# Patient Record
Sex: Female | Born: 1970 | Race: White | Hispanic: No | Marital: Married | Smoking: Never smoker
Health system: Southern US, Community
[De-identification: ages and names within clinical notes are randomized; demographics above are authoritative.]

## PROBLEM LIST (undated history)

## (undated) DIAGNOSIS — F418 Other specified anxiety disorders: Secondary | ICD-10-CM

## (undated) DIAGNOSIS — E785 Hyperlipidemia, unspecified: Secondary | ICD-10-CM

## (undated) DIAGNOSIS — I10 Essential (primary) hypertension: Secondary | ICD-10-CM

## (undated) DIAGNOSIS — D649 Anemia, unspecified: Secondary | ICD-10-CM

## (undated) DIAGNOSIS — E119 Type 2 diabetes mellitus without complications: Secondary | ICD-10-CM

## (undated) HISTORY — DX: Essential (primary) hypertension: I10

## (undated) HISTORY — DX: Type 2 diabetes mellitus without complications: E11.9

## (undated) HISTORY — DX: Hyperlipidemia, unspecified: E78.5

## (undated) HISTORY — DX: Other specified anxiety disorders: F41.8

---

## 1998-10-08 ENCOUNTER — Other Ambulatory Visit: Admission: RE | Admit: 1998-10-08 | Discharge: 1998-10-08 | Payer: Self-pay | Admitting: Obstetrics and Gynecology

## 1999-12-08 ENCOUNTER — Other Ambulatory Visit: Admission: RE | Admit: 1999-12-08 | Discharge: 1999-12-08 | Payer: Self-pay | Admitting: Obstetrics and Gynecology

## 2003-08-06 ENCOUNTER — Other Ambulatory Visit: Admission: RE | Admit: 2003-08-06 | Discharge: 2003-08-06 | Payer: Self-pay | Admitting: Obstetrics and Gynecology

## 2004-11-25 ENCOUNTER — Other Ambulatory Visit: Payer: Self-pay

## 2004-11-25 ENCOUNTER — Emergency Department: Payer: Self-pay | Admitting: Emergency Medicine

## 2013-02-18 ENCOUNTER — Ambulatory Visit: Payer: Self-pay | Admitting: Obstetrics and Gynecology

## 2013-02-19 ENCOUNTER — Ambulatory Visit: Payer: Self-pay | Admitting: Obstetrics and Gynecology

## 2013-04-18 ENCOUNTER — Ambulatory Visit: Payer: Self-pay

## 2014-02-04 LAB — HM DIABETES EYE EXAM

## 2015-02-04 ENCOUNTER — Other Ambulatory Visit: Payer: Self-pay | Admitting: Unknown Physician Specialty

## 2015-03-19 ENCOUNTER — Ambulatory Visit: Payer: Self-pay | Admitting: Unknown Physician Specialty

## 2015-04-30 ENCOUNTER — Other Ambulatory Visit: Payer: Self-pay | Admitting: Unknown Physician Specialty

## 2015-05-31 ENCOUNTER — Other Ambulatory Visit: Payer: Self-pay | Admitting: Unknown Physician Specialty

## 2015-09-11 ENCOUNTER — Other Ambulatory Visit: Payer: Self-pay | Admitting: Unknown Physician Specialty

## 2015-10-17 ENCOUNTER — Other Ambulatory Visit: Payer: Self-pay | Admitting: Unknown Physician Specialty

## 2015-10-20 ENCOUNTER — Other Ambulatory Visit: Payer: Self-pay | Admitting: Unknown Physician Specialty

## 2015-10-22 DIAGNOSIS — I10 Essential (primary) hypertension: Secondary | ICD-10-CM | POA: Insufficient documentation

## 2015-10-22 DIAGNOSIS — F418 Other specified anxiety disorders: Secondary | ICD-10-CM | POA: Insufficient documentation

## 2015-10-22 DIAGNOSIS — E1165 Type 2 diabetes mellitus with hyperglycemia: Secondary | ICD-10-CM | POA: Insufficient documentation

## 2015-10-22 DIAGNOSIS — E785 Hyperlipidemia, unspecified: Secondary | ICD-10-CM | POA: Insufficient documentation

## 2015-10-26 ENCOUNTER — Encounter: Payer: Self-pay | Admitting: Unknown Physician Specialty

## 2015-10-26 ENCOUNTER — Ambulatory Visit (INDEPENDENT_AMBULATORY_CARE_PROVIDER_SITE_OTHER): Payer: BC Managed Care – PPO | Admitting: Unknown Physician Specialty

## 2015-10-26 VITALS — BP 154/90 | HR 114 | Temp 98.4°F | Ht 64.2 in | Wt 164.2 lb

## 2015-10-26 DIAGNOSIS — I10 Essential (primary) hypertension: Secondary | ICD-10-CM

## 2015-10-26 DIAGNOSIS — E119 Type 2 diabetes mellitus without complications: Secondary | ICD-10-CM | POA: Diagnosis not present

## 2015-10-26 DIAGNOSIS — E1165 Type 2 diabetes mellitus with hyperglycemia: Secondary | ICD-10-CM

## 2015-10-26 DIAGNOSIS — E785 Hyperlipidemia, unspecified: Secondary | ICD-10-CM | POA: Diagnosis not present

## 2015-10-26 LAB — BAYER DCA HB A1C WAIVED: HB A1C (BAYER DCA - WAIVED): 13.2 % — ABNORMAL HIGH (ref <7.0)

## 2015-10-26 LAB — MICROALBUMIN, URINE WAIVED
Creatinine, Urine Waived: 50 mg/dL (ref 10–300)
Microalb, Ur Waived: 10 mg/L (ref 0–19)

## 2015-10-26 LAB — LIPID PANEL PICCOLO, WAIVED
Chol/HDL Ratio Piccolo,Waive: 3.9 mg/dL
Cholesterol Piccolo, Waived: 151 mg/dL (ref <200)
HDL Chol Piccolo, Waived: 38 mg/dL — ABNORMAL LOW (ref >59)
LDL Chol Calc Piccolo Waived: 86 mg/dL (ref <100)
Triglycerides Piccolo,Waived: 133 mg/dL (ref <150)
VLDL Chol Calc Piccolo,Waive: 27 mg/dL (ref <30)

## 2015-10-26 MED ORDER — EMPAGLIFLOZIN 25 MG PO TABS: 25.0000 mg | ORAL_TABLET | Freq: Every day | ORAL | Status: DC

## 2015-10-26 MED ORDER — AMLODIPINE BESYLATE 5 MG PO TABS: 5.0000 mg | ORAL_TABLET | Freq: Every day | ORAL | Status: DC

## 2015-10-26 MED ORDER — ATORVASTATIN CALCIUM 10 MG PO TABS: 10.0000 mg | ORAL_TABLET | Freq: Every day | ORAL | Status: DC

## 2015-10-26 MED ORDER — HYDROCHLOROTHIAZIDE 25 MG PO TABS: 25.0000 mg | ORAL_TABLET | Freq: Every day | ORAL | Status: DC

## 2015-10-26 MED ORDER — VALSARTAN 160 MG PO TABS: 160.0000 mg | ORAL_TABLET | Freq: Every day | ORAL | Status: DC

## 2015-10-26 MED ORDER — METFORMIN HCL ER 500 MG PO TB24
1000.0000 mg | ORAL_TABLET | Freq: Two times a day (BID) | ORAL | Status: DC
Start: 2015-10-26 — End: 2016-07-06

## 2015-10-26 NOTE — Progress Notes (Signed)
BP 154/90 mmHg  Pulse 114  Temp(Src) 98.4 F (36.9 C)  Ht 5' 4.2" (1.631 m)  Wt 164 lb 3.2 oz (74.481 kg)  BMI 28.00 kg/m2  SpO2 100%  LMP 08/30/2015 (Approximate)   Subjective:    Patient ID: Ellen May, female    DOB: 1971/02/27, 45 y.o.   MRN: [id number]  HPI: Ellen May is a 45 y.o. female  Chief Complaint  Patient presents with  . Diabetes  . Hyperlipidemia  . Hypertension   This pt is lost to f/u.    Diabetes:  Her insurance is no longer covering Invokanna.  She said she really liked it as it helped her lose weight Using medications without difficulties No hypoglycemic episodes No hyperglycemic episodes Feet problems  Blood Sugars averaging 130-140 while on Invokanna eye exam within last year  Hypertension:  Using medications without difficulty Average home BPs120'2/80's  Using medication without problems or lightheadedness No chest pain with exertion or shortness of breath No Edema  Elevated Cholesterol: Using medications without problems No Muscle aches Diet compliance: good Exercise:Regular and daily ----- Relevant past medical, surgical, family and social history reviewed and updated as indicated. Interim medical history since our last visit reviewed. Allergies and medications reviewed and updated.  Review of Systems  Per HPI unless specifically indicated above     Objective:    BP 154/90 mmHg  Pulse 114  Temp(Src) 98.4 F (36.9 C)  Ht 5' 4.2" (1.631 m)  Wt 164 lb 3.2 oz (74.481 kg)  BMI 28.00 kg/m2  SpO2 100%  LMP 08/30/2015 (Approximate)  Wt Readings from Last 3 Encounters:  10/26/15 164 lb 3.2 oz (74.481 kg)  12/18/14 162 lb (73.483 kg)    Physical Exam  Constitutional: She is oriented to person, place, and time. She appears well-developed and well-nourished. No distress.  HENT:  Head: Normocephalic and atraumatic.  Eyes: Conjunctivae and lids are normal. Right eye exhibits no discharge. Left eye exhibits no  discharge. No scleral icterus.  Neck: Normal range of motion. Neck supple. No JVD present. Carotid bruit is not present.  Cardiovascular: Normal rate, regular rhythm and normal heart sounds.   Pulmonary/Chest: Effort normal and breath sounds normal.  Abdominal: Normal appearance. There is no splenomegaly or hepatomegaly.  Musculoskeletal: Normal range of motion.  Neurological: She is alert and oriented to person, place, and time.  Skin: Skin is warm, dry and intact. No rash noted. No pallor.  Psychiatric: She has a normal mood and affect. Her behavior is normal. Judgment and thought content normal.    Results for orders placed or performed in visit on 10/22/15  HM DIABETES EYE EXAM  Result Value Ref Range   HM Diabetic Eye Exam Retinopathy (A) No Retinopathy      Assessment & Plan:   Problem List Items Addressed This Visit      Unprioritized   Hyperlipidemia    LDL is 88.  Continue present medication      Relevant Medications   amLODipine (NORVASC) 5 MG tablet   atorvastatin (LIPITOR) 10 MG tablet   hydrochlorothiazide (HYDRODIURIL) 25 MG tablet   valsartan (DIOVAN) 160 MG tablet   Hypertension    White coat hypertension      Relevant Medications   amLODipine (NORVASC) 5 MG tablet   atorvastatin (LIPITOR) 10 MG tablet   hydrochlorothiazide (HYDRODIURIL) 25 MG tablet   valsartan (DIOVAN) 160 MG tablet   Poorly controlled type 2 diabetes mellitus (HCC) - Primary    Hgb  A1C 13.2.  Pt stopped Invokana due to cost.  Unwilling to start insulin.  Will start Jardiance 25 mg daily which should be covered      Relevant Medications   empagliflozin (JARDIANCE) 25 MG TABS tablet   atorvastatin (LIPITOR) 10 MG tablet   metFORMIN (GLUCOPHAGE-XR) 500 MG 24 hr tablet   valsartan (DIOVAN) 160 MG tablet       Follow up plan: Return in about 3 months (around 01/25/2016).  Discussed with pt lost to f/u.  She says she will do better coming in on a regular basis

## 2015-10-26 NOTE — Assessment & Plan Note (Signed)
White coat hypertension

## 2015-10-26 NOTE — Assessment & Plan Note (Addendum)
Hgb A1C 13.2.  Pt stopped Invokana due to cost.  Unwilling to start insulin.  Will start Jardiance 25 mg daily which should be covered

## 2015-10-26 NOTE — Assessment & Plan Note (Signed)
LDL is 88.  Continue present medication

## 2015-10-27 ENCOUNTER — Encounter: Payer: Self-pay | Admitting: Unknown Physician Specialty

## 2015-10-27 LAB — CBC WITH DIFFERENTIAL/PLATELET
Basophils Absolute: 0 10*3/uL (ref 0.0–0.2)
Basos: 0 %
EOS (ABSOLUTE): 0.1 10*3/uL (ref 0.0–0.4)
Eos: 1 %
Hematocrit: 37.5 % (ref 34.0–46.6)
Hemoglobin: 11.7 g/dL (ref 11.1–15.9)
Immature Grans (Abs): 0 10*3/uL (ref 0.0–0.1)
Immature Granulocytes: 0 %
Lymphocytes Absolute: 2.5 10*3/uL (ref 0.7–3.1)
Lymphs: 34 %
MCH: 22.4 pg — ABNORMAL LOW (ref 26.6–33.0)
MCHC: 31.2 g/dL — ABNORMAL LOW (ref 31.5–35.7)
MCV: 72 fL — ABNORMAL LOW (ref 79–97)
Monocytes Absolute: 0.4 10*3/uL (ref 0.1–0.9)
Monocytes: 6 %
Neutrophils Absolute: 4.3 10*3/uL (ref 1.4–7.0)
Neutrophils: 59 %
Platelets: 314 10*3/uL (ref 150–379)
RBC: 5.23 x10E6/uL (ref 3.77–5.28)
RDW: 15.7 % — ABNORMAL HIGH (ref 12.3–15.4)
WBC: 7.3 10*3/uL (ref 3.4–10.8)

## 2015-10-27 LAB — COMPREHENSIVE METABOLIC PANEL
ALT: 63 IU/L — ABNORMAL HIGH (ref 0–32)
AST: 44 IU/L — ABNORMAL HIGH (ref 0–40)
Albumin/Globulin Ratio: 1.6 (ref 1.2–2.2)
Albumin: 4 g/dL (ref 3.5–5.5)
Alkaline Phosphatase: 84 IU/L (ref 39–117)
BUN/Creatinine Ratio: 13 (ref 9–23)
BUN: 6 mg/dL (ref 6–24)
Bilirubin Total: 0.4 mg/dL (ref 0.0–1.2)
CO2: 21 mmol/L (ref 18–29)
Calcium: 9.1 mg/dL (ref 8.7–10.2)
Chloride: 97 mmol/L (ref 96–106)
Creatinine, Ser: 0.45 mg/dL — ABNORMAL LOW (ref 0.57–1.00)
GFR calc Af Amer: 141 mL/min/{1.73_m2} (ref >59)
GFR calc non Af Amer: 122 mL/min/{1.73_m2} (ref >59)
Globulin, Total: 2.5 g/dL (ref 1.5–4.5)
Glucose: 298 mg/dL — ABNORMAL HIGH (ref 65–99)
Potassium: 4.2 mmol/L (ref 3.5–5.2)
Sodium: 135 mmol/L (ref 134–144)
Total Protein: 6.5 g/dL (ref 6.0–8.5)

## 2015-10-27 LAB — URIC ACID: Uric Acid: 2.4 mg/dL — ABNORMAL LOW (ref 2.5–7.1)

## 2015-10-27 NOTE — Progress Notes (Signed)
Quick Note:  Letter sent notifying about labs. Discussed hyperglycemia and effect on liver ______

## 2016-01-31 ENCOUNTER — Ambulatory Visit: Payer: BC Managed Care – PPO | Admitting: Unknown Physician Specialty

## 2016-02-12 ENCOUNTER — Other Ambulatory Visit: Payer: Self-pay | Admitting: Unknown Physician Specialty

## 2016-02-14 NOTE — Telephone Encounter (Signed)
Your patient.  Thanks 

## 2016-05-14 ENCOUNTER — Other Ambulatory Visit: Payer: Self-pay | Admitting: Unknown Physician Specialty

## 2016-06-12 ENCOUNTER — Other Ambulatory Visit: Payer: Self-pay | Admitting: Unknown Physician Specialty

## 2016-07-06 ENCOUNTER — Telehealth: Payer: Self-pay | Admitting: Unknown Physician Specialty

## 2016-07-06 MED ORDER — AMLODIPINE BESYLATE 5 MG PO TABS: 5.0000 mg | ORAL_TABLET | Freq: Every day | ORAL | 2 refills | Status: DC

## 2016-07-06 MED ORDER — METFORMIN HCL ER 500 MG PO TB24: 1000.0000 mg | ORAL_TABLET | Freq: Two times a day (BID) | ORAL | 3 refills | Status: DC

## 2016-07-06 MED ORDER — EMPAGLIFLOZIN 25 MG PO TABS: 25.0000 mg | ORAL_TABLET | Freq: Every day | ORAL | 3 refills | Status: DC

## 2016-07-06 MED ORDER — ATORVASTATIN CALCIUM 10 MG PO TABS: 10.0000 mg | ORAL_TABLET | Freq: Every day | ORAL | 3 refills | Status: DC

## 2016-07-06 MED ORDER — HYDROCHLOROTHIAZIDE 25 MG PO TABS: 25.0000 mg | ORAL_TABLET | Freq: Every day | ORAL | 3 refills | Status: DC

## 2016-07-06 MED ORDER — VALSARTAN 160 MG PO TABS: 160.0000 mg | ORAL_TABLET | Freq: Every day | ORAL | 3 refills | Status: DC

## 2016-07-06 NOTE — Telephone Encounter (Signed)
Routing to provider. Patient has an appointment 08/01/15.

## 2016-07-06 NOTE — Telephone Encounter (Signed)
Pt called for a refill on all of her medications. [facility]. Appt scheduled 07/28/16.

## 2016-07-28 ENCOUNTER — Ambulatory Visit: Payer: BC Managed Care – PPO | Admitting: Unknown Physician Specialty

## 2016-08-04 ENCOUNTER — Ambulatory Visit: Payer: BC Managed Care – PPO | Admitting: Unknown Physician Specialty

## 2016-08-14 ENCOUNTER — Encounter: Payer: Self-pay | Admitting: Unknown Physician Specialty

## 2016-08-14 ENCOUNTER — Ambulatory Visit (INDEPENDENT_AMBULATORY_CARE_PROVIDER_SITE_OTHER): Payer: BC Managed Care – PPO | Admitting: Unknown Physician Specialty

## 2016-08-14 VITALS — BP 174/102 | HR 108 | Temp 98.2°F | Ht 66.0 in | Wt 158.0 lb

## 2016-08-14 DIAGNOSIS — E1165 Type 2 diabetes mellitus with hyperglycemia: Secondary | ICD-10-CM | POA: Diagnosis not present

## 2016-08-14 DIAGNOSIS — R059 Cough, unspecified: Secondary | ICD-10-CM

## 2016-08-14 DIAGNOSIS — R509 Fever, unspecified: Secondary | ICD-10-CM | POA: Diagnosis not present

## 2016-08-14 DIAGNOSIS — I10 Essential (primary) hypertension: Secondary | ICD-10-CM | POA: Diagnosis not present

## 2016-08-14 DIAGNOSIS — E7800 Pure hypercholesterolemia, unspecified: Secondary | ICD-10-CM | POA: Diagnosis not present

## 2016-08-14 DIAGNOSIS — G2581 Restless legs syndrome: Secondary | ICD-10-CM | POA: Insufficient documentation

## 2016-08-14 DIAGNOSIS — R05 Cough: Secondary | ICD-10-CM | POA: Diagnosis not present

## 2016-08-14 LAB — BAYER DCA HB A1C WAIVED: HB A1C (BAYER DCA - WAIVED): 12.2 % — ABNORMAL HIGH (ref <7.0)

## 2016-08-14 LAB — MICROALBUMIN, URINE WAIVED
Creatinine, Urine Waived: 200 mg/dL (ref 10–300)
Microalb, Ur Waived: 150 mg/L — ABNORMAL HIGH (ref 0–19)

## 2016-08-14 LAB — VERITOR FLU A/B WAIVED
Influenza A: NEGATIVE
Influenza B: NEGATIVE

## 2016-08-14 MED ORDER — AZITHROMYCIN 250 MG PO TABS: ORAL_TABLET | ORAL | 0 refills | Status: DC

## 2016-08-14 MED ORDER — HYDROCOD POLST-CPM POLST ER 10-8 MG/5ML PO SUER: 5.0000 mL | Freq: Two times a day (BID) | ORAL | 0 refills | Status: DC | PRN

## 2016-08-14 NOTE — Assessment & Plan Note (Addendum)
High today.  Pt admits not taking medicine for several days.  Patient advised to restart medications and check blood pressure measurements at home regularly. She will return to clinic in one month with measurements for review.

## 2016-08-14 NOTE — Assessment & Plan Note (Signed)
Checking ferratin levels today.

## 2016-08-14 NOTE — Progress Notes (Signed)
BP (!) 174/102 (BP Location: Left Arm, Patient Position: Sitting, Cuff Size: Normal)   Pulse (!) 108   Temp 98.2 F (36.8 C)   Ht 5\' 6"  (1.676 m) Comment: pt had shoes on  Wt 158 lb (71.7 kg) Comment: pt had shoes on  LMP 08/11/2016 (Exact Date)   SpO2 99%   BMI 25.50 kg/m    Subjective:    Patient ID: Ellen May, female    DOB: Aug 24, 1970, 46 y.o.   MRN: [id number]  HPI: Ellen May is a 46 y.o. female    Chief Complaint  Patient presents with  . Diabetes    pt states last eye exam in chart is correct  . Hyperlipidemia  . Hypertension  . other    pt states she had not taken any medications since Thursday  . URI    pt states she has had a fever, congestion, cough, some body aches, and chills. States this started Friday  . fidgeting    pt states she has been fidgeting a lot for the past few months     URI Patient notes that she had a fever of 101 degrees without any symptoms about a week and a half ago, which resolved on its own within a couple of days. She then started feeling fatigued with a cough, congestion, ear pain, lack of appetite, chills and a fever of 102.5 about 3 days ago. She has been using flonase and taking ibuprofen and coricidin, which she reports have helped with her fever, but not the rest of her symptoms. States that her symptoms have gotten neither better nor worse over the past 3 days.  Diabetes Not taking medications for the last few days due to lack of appetite and not feeling well, but reports that she normally takes them as prescribed. She does not check her blood glucose regularly at home, however she states that when she does they normally run high. Denies vision changes or any numbness/tingling in her hands or feet.  Hypertension Not taking medications for the last few days due to lack of appetite and not feeling well, but reports that she normally takes them as prescribed. Her blood pressure today is 174/102 mmHg, which she states is  better outside of the office, however she does not check her blood pressure regularly at home.   Hyperlipidemia Not taking medications for the last few days due to lack of appetite and not feeling well, but reports that she normally takes them as prescribed. She does not exercise regularly and states that her diet is "normal."  Sleeplessness/Restless Reports she is having difficulty sleeping the last couple of months due to "fidgeting." Notes that it feels like her "legs are anxious." She has taken ibuprofen pm with some relief. Sister has a history of restless leg syndrome and wonders if she had the same thing.  Pt reports heavy menses.     Relevant past medical, surgical, family and social history reviewed and updated as indicated. Interim medical history since our last visit reviewed. Allergies and medications reviewed and updated.  Review of Systems  Constitutional: Positive for appetite change, chills, diaphoresis, fatigue and fever.  HENT: Positive for congestion, postnasal drip, rhinorrhea, sinus pressure, sneezing and tinnitus. Negative for ear discharge, ear pain, facial swelling, hearing loss, sinus pain, sore throat and trouble swallowing.   Eyes: Negative for pain, discharge, itching and visual disturbance.  Respiratory: Positive for cough. Negative for chest tightness, shortness of breath and wheezing.   Cardiovascular:  Negative for chest pain, palpitations and leg swelling.  Gastrointestinal: Negative for abdominal pain, constipation, diarrhea, nausea and vomiting.  Endocrine: Negative.   Genitourinary: Negative for dysuria and frequency.  Musculoskeletal: Negative.  Negative for arthralgias.  Neurological: Negative for dizziness, light-headedness, numbness and headaches.  Psychiatric/Behavioral: Negative.     Per HPI unless specifically indicated above     Objective:    BP (!) 174/102 (BP Location: Left Arm, Patient Position: Sitting, Cuff Size: Normal)   Pulse (!) 108    Temp 98.2 F (36.8 C)   Ht 5\' 6"  (1.676 m) Comment: pt had shoes on  Wt 158 lb (71.7 kg) Comment: pt had shoes on  LMP 08/11/2016 (Exact Date)   SpO2 99%   BMI 25.50 kg/m   Wt Readings from Last 3 Encounters:  08/14/16 158 lb (71.7 kg)  10/26/15 164 lb 3.2 oz (74.5 kg)  12/18/14 162 lb (73.5 kg)    Physical Exam  Constitutional: She is oriented to person, place, and time. She appears well-developed and well-nourished.  HENT:  Head: Normocephalic and atraumatic.  Right Ear: External ear and ear canal normal.  Left Ear: External ear and ear canal normal.  Nose: Nose normal.  Mouth/Throat: Uvula is midline, oropharynx is clear and moist and mucous membranes are normal.  Eyes: Conjunctivae are normal. Right eye exhibits no discharge. Left eye exhibits no discharge. No scleral icterus.  Neck: Neck supple.    Slight tenderness to palpation over right submandibular lymph node.  Cardiovascular: Normal heart sounds.  Tachycardia present.   Pulmonary/Chest: Effort normal and breath sounds normal. No respiratory distress. She has no wheezes.  Abdominal: Soft. She exhibits no mass. There is no tenderness. There is no rebound and no guarding.  Neurological: She is alert and oriented to person, place, and time.  Skin: She is diaphoretic.  Psychiatric: She has a normal mood and affect. Her behavior is normal. Judgment and thought content normal.    Results for orders placed or performed in visit on 08/14/16  Veritor Flu A/B Waived  Result Value Ref Range   Influenza A Negative Negative   Influenza B Negative Negative      Assessment & Plan:   Problem List Items Addressed This Visit      Unprioritized   Hyperlipidemia    Lipid panel drawn today.      Relevant Orders   Lipid Panel w/o Chol/HDL Ratio   Hypertension    High today.  Pt admits not taking medicine for several days.  Patient advised to restart medications and check blood pressure measurements at home regularly. She  will return to clinic in one month with measurements for review.      Poorly controlled type 2 diabetes mellitus ([place])    Pt admits to non-compliance.  Refuses injectables.  Referred to endocrinology as she is not motivated at our office. Patient also encouraged to see ophthalmologist for eye exam.      Relevant Orders   Bayer DCA Hb A1c Waived   Comprehensive metabolic panel   Microalbumin, Urine Waived   Uric acid   Ambulatory referral to Endocrinology    Other Visit Diagnoses    Fever, unspecified fever cause    -  Primary   Relevant Orders   Veritor Flu A/B Waived (Completed)   CBC with Differential/Platelet   Cough       Flu negative.  Z pack as high risk patient.  Tussionex for symptoms.     Relevant Medications  chlorpheniramine-HYDROcodone (TUSSIONEX PENNKINETIC ER) 10-8 MG/5ML SUER   Other Relevant Orders   Veritor Flu A/B Waived (Completed)   CBC with Differential/Platelet   RLS (restless legs syndrome)       Checking ferratin level today.   Relevant Orders   Ferritin       Follow up plan: Return in about 4 weeks (around 09/11/2016).

## 2016-08-14 NOTE — Assessment & Plan Note (Signed)
Lipid panel drawn today 

## 2016-08-14 NOTE — Assessment & Plan Note (Addendum)
Pt admits to non-compliance.  Refuses injectables.  Referred to endocrinology as she is not motivated at our office. Patient also encouraged to see ophthalmologist for eye exam.

## 2016-08-15 LAB — URIC ACID: Uric Acid: 2.4 mg/dL — ABNORMAL LOW (ref 2.5–7.1)

## 2016-08-15 LAB — CBC WITH DIFFERENTIAL/PLATELET
Basophils Absolute: 0 10*3/uL (ref 0.0–0.2)
Basos: 1 %
EOS (ABSOLUTE): 0 10*3/uL (ref 0.0–0.4)
Eos: 1 %
Hematocrit: 39.3 % (ref 34.0–46.6)
Hemoglobin: 11.7 g/dL (ref 11.1–15.9)
Immature Grans (Abs): 0 10*3/uL (ref 0.0–0.1)
Immature Granulocytes: 1 %
Lymphocytes Absolute: 1.4 10*3/uL (ref 0.7–3.1)
Lymphs: 38 %
MCH: 20.6 pg — ABNORMAL LOW (ref 26.6–33.0)
MCHC: 29.8 g/dL — ABNORMAL LOW (ref 31.5–35.7)
MCV: 69 fL — ABNORMAL LOW (ref 79–97)
Monocytes Absolute: 0.4 10*3/uL (ref 0.1–0.9)
Monocytes: 12 %
Neutrophils Absolute: 1.7 10*3/uL (ref 1.4–7.0)
Neutrophils: 47 %
Platelets: 283 10*3/uL (ref 150–379)
RBC: 5.67 x10E6/uL — ABNORMAL HIGH (ref 3.77–5.28)
RDW: 16.1 % — ABNORMAL HIGH (ref 12.3–15.4)
WBC: 3.6 10*3/uL (ref 3.4–10.8)

## 2016-08-15 LAB — LIPID PANEL W/O CHOL/HDL RATIO
Cholesterol, Total: 182 mg/dL (ref 100–199)
HDL: 28 mg/dL — ABNORMAL LOW (ref >39)
LDL Calculated: 111 mg/dL — ABNORMAL HIGH (ref 0–99)
Triglycerides: 213 mg/dL — ABNORMAL HIGH (ref 0–149)
VLDL Cholesterol Cal: 43 mg/dL — ABNORMAL HIGH (ref 5–40)

## 2016-08-15 LAB — COMPREHENSIVE METABOLIC PANEL
ALT: 136 IU/L — ABNORMAL HIGH (ref 0–32)
AST: 54 IU/L — ABNORMAL HIGH (ref 0–40)
Albumin/Globulin Ratio: 1.3 (ref 1.2–2.2)
Albumin: 3.6 g/dL (ref 3.5–5.5)
Alkaline Phosphatase: 78 IU/L (ref 39–117)
BUN/Creatinine Ratio: 16 (ref 9–23)
BUN: 7 mg/dL (ref 6–24)
Bilirubin Total: 0.2 mg/dL (ref 0.0–1.2)
CO2: 20 mmol/L (ref 18–29)
Calcium: 8.9 mg/dL (ref 8.7–10.2)
Chloride: 99 mmol/L (ref 96–106)
Creatinine, Ser: 0.44 mg/dL — ABNORMAL LOW (ref 0.57–1.00)
GFR calc Af Amer: 141 mL/min/{1.73_m2} (ref >59)
GFR calc non Af Amer: 122 mL/min/{1.73_m2} (ref >59)
Globulin, Total: 2.8 g/dL (ref 1.5–4.5)
Glucose: 287 mg/dL — ABNORMAL HIGH (ref 65–99)
Potassium: 4.4 mmol/L (ref 3.5–5.2)
Sodium: 136 mmol/L (ref 134–144)
Total Protein: 6.4 g/dL (ref 6.0–8.5)

## 2016-08-17 LAB — FERRITIN: Ferritin: 25 ng/mL (ref 15–150)

## 2016-08-18 ENCOUNTER — Encounter: Payer: Self-pay | Admitting: Unknown Physician Specialty

## 2016-09-11 ENCOUNTER — Ambulatory Visit: Payer: BC Managed Care – PPO | Admitting: Unknown Physician Specialty

## 2016-09-15 ENCOUNTER — Encounter: Payer: Self-pay | Admitting: Unknown Physician Specialty

## 2016-09-15 ENCOUNTER — Ambulatory Visit (INDEPENDENT_AMBULATORY_CARE_PROVIDER_SITE_OTHER): Payer: BC Managed Care – PPO | Admitting: Unknown Physician Specialty

## 2016-09-15 DIAGNOSIS — I10 Essential (primary) hypertension: Secondary | ICD-10-CM

## 2016-09-15 MED ORDER — VALSARTAN-HYDROCHLOROTHIAZIDE 160-25 MG PO TABS: 1.0000 | ORAL_TABLET | Freq: Every day | ORAL | 1 refills | Status: DC

## 2016-09-15 NOTE — Progress Notes (Signed)
   BP (!) 181/104 (BP Location: Left Arm, Cuff Size: Normal)   Pulse (!) 121   Temp 98.3 F (36.8 C)   Wt 164 lb 9.6 oz (74.7 kg)   LMP 09/11/2016 (Exact Date)   SpO2 100%   BMI 26.57 kg/m    Subjective:    Patient ID: Ellen May, female    DOB: 11-Dec-1970, 46 y.o.   MRN: [id number]  HPI: Ellen May is a 46 y.o. female  Chief Complaint  Patient presents with  . Hypertension    4 week f/up   Has an appointment with endocrine next week.  She has been non-colpliant with me and hoping Endocrine will have more success.    Hypertension Pt has been without her BP medications since last visit.   Average home BPs  140's-160s/77-89  No problems or lightheadedness No chest pain with exertion or shortness of breath No Edema     Relevant past medical, surgical, family and social history reviewed and updated as indicated. Interim medical history since our last visit reviewed. Allergies and medications reviewed and updated.  Review of Systems  Per HPI unless specifically indicated above     Objective:    BP (!) 181/104 (BP Location: Left Arm, Cuff Size: Normal)   Pulse (!) 121   Temp 98.3 F (36.8 C)   Wt 164 lb 9.6 oz (74.7 kg)   LMP 09/11/2016 (Exact Date)   SpO2 100%   BMI 26.57 kg/m   Wt Readings from Last 3 Encounters:  09/15/16 164 lb 9.6 oz (74.7 kg)  08/14/16 158 lb (71.7 kg)  10/26/15 164 lb 3.2 oz (74.5 kg)    Physical Exam  Constitutional: She is oriented to person, place, and time. She appears well-developed and well-nourished. No distress.  HENT:  Head: Normocephalic and atraumatic.  Eyes: Conjunctivae and lids are normal. Right eye exhibits no discharge. Left eye exhibits no discharge. No scleral icterus.  Neck: Normal range of motion. Neck supple. No JVD present. Carotid bruit is not present.  Cardiovascular: Normal rate, regular rhythm and normal heart sounds.   Pulmonary/Chest: Effort normal and breath sounds normal.  Abdominal: Normal  appearance. There is no splenomegaly or hepatomegaly.  Musculoskeletal: Normal range of motion.  Neurological: She is alert and oriented to person, place, and time.  Skin: Skin is warm, dry and intact. No rash noted. No pallor.  Psychiatric: She has a normal mood and affect. Her behavior is normal. Judgment and thought content normal.     Assessment & Plan:   Problem List Items Addressed This Visit      Unprioritized   Hypertension    Poor control with non-compliance.  Will try to decrease pill numbers as pt states she is more likely to take her medications if the numbers are decreased.        Relevant Medications   valsartan-hydrochlorothiazide (DIOVAN-HCT) 160-25 MG tablet       Follow up plan: Return in about 4 weeks (around 10/13/2016).

## 2016-09-15 NOTE — Assessment & Plan Note (Signed)
Poor control with non-compliance.  Will try to decrease pill numbers as pt states she is more likely to take her medications if the numbers are decreased.

## 2016-10-13 ENCOUNTER — Ambulatory Visit (INDEPENDENT_AMBULATORY_CARE_PROVIDER_SITE_OTHER): Payer: BC Managed Care – PPO | Admitting: Unknown Physician Specialty

## 2016-10-13 ENCOUNTER — Encounter: Payer: Self-pay | Admitting: Unknown Physician Specialty

## 2016-10-13 DIAGNOSIS — E1165 Type 2 diabetes mellitus with hyperglycemia: Secondary | ICD-10-CM

## 2016-10-13 DIAGNOSIS — I10 Essential (primary) hypertension: Secondary | ICD-10-CM | POA: Diagnosis not present

## 2016-10-13 NOTE — Assessment & Plan Note (Signed)
Trouble getting started with Ozempic.  Gave a .25 mg injection in the office.

## 2016-10-13 NOTE — Progress Notes (Signed)
BP (!) 153/93 (BP Location: Left Arm, Cuff Size: Normal)   Pulse (!) 128   Temp 97.9 F (36.6 C)   Wt 162 lb 9.6 oz (73.8 kg)   LMP 10/08/2016 (Exact Date)   SpO2 100%   BMI 26.24 kg/m    Subjective:    Patient ID: Ellen May, female    DOB: 08-08-1970, 46 y.o.   MRN: [id number]  HPI: Ellen May is a 46 y.o. female  Chief Complaint  Patient presents with  . Hypertension   Pt went to Endocrine as requested.  Note reviewed and added medication but not yet any insulin. She is not yet taking her shots of Semaglutide.    Hypertension Pt is here to f/u of her BP.  It is high here but noted at work it was SBP 114.  Denies SOB or chest pain.     Relevant past medical, surgical, family and social history reviewed and updated as indicated. Interim medical history since our last visit reviewed. Allergies and medications reviewed and updated.  Review of Systems  Per HPI unless specifically indicated above     Objective:    BP (!) 153/93 (BP Location: Left Arm, Cuff Size: Normal)   Pulse (!) 128   Temp 97.9 F (36.6 C)   Wt 162 lb 9.6 oz (73.8 kg)   LMP 10/08/2016 (Exact Date)   SpO2 100%   BMI 26.24 kg/m   Wt Readings from Last 3 Encounters:  10/13/16 162 lb 9.6 oz (73.8 kg)  09/15/16 164 lb 9.6 oz (74.7 kg)  08/14/16 158 lb (71.7 kg)    Physical Exam  Constitutional: She is oriented to person, place, and time. She appears well-developed and well-nourished. No distress.  HENT:  Head: Normocephalic and atraumatic.  Eyes: Conjunctivae and lids are normal. Right eye exhibits no discharge. Left eye exhibits no discharge. No scleral icterus.  Neck: Normal range of motion. Neck supple. No JVD present. Carotid bruit is not present.  Cardiovascular: Normal rate, regular rhythm and normal heart sounds.   Pulmonary/Chest: Effort normal and breath sounds normal.  Abdominal: Normal appearance. There is no splenomegaly or hepatomegaly.  Musculoskeletal: Normal range  of motion.  Neurological: She is alert and oriented to person, place, and time.  Skin: Skin is warm, dry and intact. No rash noted. No pallor.  Psychiatric: She has a normal mood and affect. Her behavior is normal. Judgment and thought content normal.    Results for orders placed or performed in visit on 08/14/16  Veritor Flu A/B Waived  Result Value Ref Range   Influenza A Negative Negative   Influenza B Negative Negative  Bayer DCA Hb A1c Waived  Result Value Ref Range   Bayer DCA Hb A1c Waived 12.2 (H) <7.0 %  Comprehensive metabolic panel  Result Value Ref Range   Glucose 287 (H) 65 - 99 mg/dL   BUN 7 6 - 24 mg/dL   Creatinine, Ser 0.44 (L) 0.57 - 1.00 mg/dL   GFR calc non Af Amer 122 >59 mL/min/1.73   GFR calc Af Amer 141 >59 mL/min/1.73   BUN/Creatinine Ratio 16 9 - 23   Sodium 136 134 - 144 mmol/L   Potassium 4.4 3.5 - 5.2 mmol/L   Chloride 99 96 - 106 mmol/L   CO2 20 18 - 29 mmol/L   Calcium 8.9 8.7 - 10.2 mg/dL   Total Protein 6.4 6.0 - 8.5 g/dL   Albumin 3.6 3.5 - 5.5 g/dL   Globulin,  Total 2.8 1.5 - 4.5 g/dL   Albumin/Globulin Ratio 1.3 1.2 - 2.2   Bilirubin Total 0.2 0.0 - 1.2 mg/dL   Alkaline Phosphatase 78 39 - 117 IU/L   AST 54 (H) 0 - 40 IU/L   ALT 136 (H) 0 - 32 IU/L  Lipid Panel w/o Chol/HDL Ratio  Result Value Ref Range   Cholesterol, Total 182 100 - 199 mg/dL   Triglycerides 213 (H) 0 - 149 mg/dL   HDL 28 (L) >39 mg/dL   VLDL Cholesterol Cal 43 (H) 5 - 40 mg/dL   LDL Calculated 111 (H) 0 - 99 mg/dL  CBC with Differential/Platelet  Result Value Ref Range   WBC 3.6 3.4 - 10.8 x10E3/uL   RBC 5.67 (H) 3.77 - 5.28 x10E6/uL   Hemoglobin 11.7 11.1 - 15.9 g/dL   Hematocrit 39.3 34.0 - 46.6 %   MCV 69 (L) 79 - 97 fL   MCH 20.6 (L) 26.6 - 33.0 pg   MCHC 29.8 (L) 31.5 - 35.7 g/dL   RDW 16.1 (H) 12.3 - 15.4 %   Platelets 283 150 - 379 x10E3/uL   Neutrophils 47 Not Estab. %   Lymphs 38 Not Estab. %   Monocytes 12 Not Estab. %   Eos 1 Not Estab. %    Basos 1 Not Estab. %   Neutrophils Absolute 1.7 1.4 - 7.0 x10E3/uL   Lymphocytes Absolute 1.4 0.7 - 3.1 x10E3/uL   Monocytes Absolute 0.4 0.1 - 0.9 x10E3/uL   EOS (ABSOLUTE) 0.0 0.0 - 0.4 x10E3/uL   Basophils Absolute 0.0 0.0 - 0.2 x10E3/uL   Immature Granulocytes 1 Not Estab. %   Immature Grans (Abs) 0.0 0.0 - 0.1 x10E3/uL  Microalbumin, Urine Waived  Result Value Ref Range   Microalb, Ur Waived 150 (H) 0 - 19 mg/L   Creatinine, Urine Waived 200 10 - 300 mg/dL   Microalb/Creat Ratio 30-300 (H) <30 mg/g  Uric acid  Result Value Ref Range   Uric Acid 2.4 (L) 2.5 - 7.1 mg/dL  Ferritin  Result Value Ref Range   Ferritin 25 15 - 150 ng/mL      Assessment & Plan:   Problem List Items Addressed This Visit      Unprioritized   Hypertension    Not to goal here but is outside the office.  Will continue present and see in 3 months      Poorly controlled type 2 diabetes mellitus ([place])    Trouble getting started with Ozempic.  Gave a .25 mg injection in the office.        Relevant Medications   empagliflozin (JARDIANCE) 10 MG TABS tablet   metFORMIN (GLUCOPHAGE-XR) 500 MG 24 hr tablet   Semaglutide 0.25 or 0.5 MG/DOSE SOPN       Follow up plan: Return in about 3 months (around 01/13/2017).

## 2016-10-13 NOTE — Assessment & Plan Note (Signed)
Not to goal here but is outside the office.  Will continue present and see in 3 months

## 2016-10-23 ENCOUNTER — Telehealth: Payer: Self-pay | Admitting: Unknown Physician Specialty

## 2016-10-23 NOTE — Telephone Encounter (Signed)
Pt would like to know when her last menstrual cycle was. Provide information and I will call the pt back.

## 2016-10-23 NOTE — Telephone Encounter (Signed)
Pt notified but would now like a call back about jardiance dosage.

## 2016-10-23 NOTE — Telephone Encounter (Signed)
Chart says 10/08/16.

## 2016-10-24 MED ORDER — EMPAGLIFLOZIN 25 MG PO TABS: 25.0000 mg | ORAL_TABLET | Freq: Every day | ORAL | 3 refills | Status: DC

## 2016-10-24 NOTE — Telephone Encounter (Signed)
Called and left patient a VM asking for her to please return my call.  

## 2016-10-24 NOTE — Telephone Encounter (Signed)
Called and spoke to patient. She states she is not sure what dosage of jardiance she is supposed to be on. She states that [facility] gave her the 25 mg and she goes to see endocrine in 2 weeks. She is not sure what dose the endocrinologist wants her on but states she has always taken the 25 mg. She asked if Malachy Mood could send in a refill on the 25 mg to get patient to her appointment in 2 weeks with endocrine. [facility]

## 2016-11-13 ENCOUNTER — Other Ambulatory Visit: Payer: Self-pay | Admitting: Unknown Physician Specialty

## 2017-01-10 ENCOUNTER — Other Ambulatory Visit: Payer: Self-pay | Admitting: Unknown Physician Specialty

## 2017-01-11 ENCOUNTER — Other Ambulatory Visit: Payer: Self-pay | Admitting: Unknown Physician Specialty

## 2017-01-17 ENCOUNTER — Ambulatory Visit: Payer: BC Managed Care – PPO | Admitting: Unknown Physician Specialty

## 2017-01-17 ENCOUNTER — Other Ambulatory Visit: Payer: Self-pay

## 2017-02-12 ENCOUNTER — Telehealth: Payer: Self-pay

## 2017-02-12 MED ORDER — TELMISARTAN-HCTZ 80-25 MG PO TABS: 1.0000 | ORAL_TABLET | Freq: Every day | ORAL | 1 refills | Status: DC

## 2017-02-12 NOTE — Telephone Encounter (Signed)
Called and left patient a VM asking for her to please return my call.  

## 2017-02-12 NOTE — Telephone Encounter (Signed)
Will need to monitor BP.  Please come in 1 month

## 2017-02-12 NOTE — Telephone Encounter (Signed)
Pharmacy sent a fax stating "As you are aware, a massive recall was issued on Valsartan last week. Though the brand of Valsartan we carry was not part of the recall, it has gone on an indefinite backorder due to high demand. Due to the backordered status, we do not know when Valsartan will be available again. Thus, we are proactively trying to obtain prescriptions for an alternative medication for all of our patient's currently taking Valsartan."   Please advise.

## 2017-02-13 NOTE — Telephone Encounter (Signed)
Called and left patient a VM asking for her to please return my call.  

## 2017-02-13 NOTE — Telephone Encounter (Signed)
Pt notified of new medication. Appt scheduled 08/27 for BP fu.

## 2017-03-19 ENCOUNTER — Ambulatory Visit (INDEPENDENT_AMBULATORY_CARE_PROVIDER_SITE_OTHER): Payer: BC Managed Care – PPO | Admitting: Unknown Physician Specialty

## 2017-03-19 ENCOUNTER — Encounter: Payer: Self-pay | Admitting: Unknown Physician Specialty

## 2017-03-19 VITALS — BP 163/106 | HR 99 | Temp 98.4°F | Ht 64.5 in | Wt 155.1 lb

## 2017-03-19 DIAGNOSIS — E7800 Pure hypercholesterolemia, unspecified: Secondary | ICD-10-CM

## 2017-03-19 DIAGNOSIS — I10 Essential (primary) hypertension: Secondary | ICD-10-CM | POA: Diagnosis not present

## 2017-03-19 DIAGNOSIS — G2581 Restless legs syndrome: Secondary | ICD-10-CM

## 2017-03-19 DIAGNOSIS — E119 Type 2 diabetes mellitus without complications: Secondary | ICD-10-CM | POA: Diagnosis not present

## 2017-03-19 LAB — BAYER DCA HB A1C WAIVED: HB A1C (BAYER DCA - WAIVED): 8 % — ABNORMAL HIGH (ref <7.0)

## 2017-03-19 MED ORDER — METOPROLOL SUCCINATE ER 25 MG PO TB24: 25.0000 mg | ORAL_TABLET | Freq: Every day | ORAL | 3 refills | Status: DC

## 2017-03-19 MED ORDER — ATORVASTATIN CALCIUM 10 MG PO TABS: 10.0000 mg | ORAL_TABLET | Freq: Every day | ORAL | 3 refills | Status: DC

## 2017-03-19 MED ORDER — PRAMIPEXOLE DIHYDROCHLORIDE 0.25 MG PO TABS: 0.2500 mg | ORAL_TABLET | Freq: Every evening | ORAL | 2 refills | Status: DC | PRN

## 2017-03-19 NOTE — Assessment & Plan Note (Signed)
Add Mirapex QHS

## 2017-03-19 NOTE — Assessment & Plan Note (Signed)
Hgb A1C is now 6.7%.  Seeing Dr. Eddie Dibbles next month.

## 2017-03-19 NOTE — Assessment & Plan Note (Signed)
Poorly controlled.  Multiple side effects from multiple medications.  Needs ARB but also Tachycardic.  Add Metoprolol and recheck in 1 month for ARB addition.

## 2017-03-19 NOTE — Progress Notes (Signed)
BP (!) 163/106 (BP Location: Left Arm, Cuff Size: Normal)   Pulse 99   Temp 98.4 F (36.9 C)   Ht 5' 4.5" (1.638 m)   Wt 155 lb 1.6 oz (70.4 kg)   LMP 03/18/2017 (Exact Date)   SpO2 98%   BMI 26.21 kg/m    Subjective:    Patient ID: Ellen May, female    DOB: 02/12/1971, 46 y.o.   MRN: [id number]  HPI: Ellen May is a 46 y.o. female  Chief Complaint  Patient presents with  . Diabetes    pt states that the last eye exam in her chart is correct  . Hyperlipidemia  . Hypertension    pt states that the telmisartan/hctz made her heart feel like it was racing    Diabetes: Seeing Dr. Eddie Dibbles.  Feels sugars are doing better with glucoses in the low 200  Hypertension  Took medication for about 1 week and made her heart race.  Tried it for 1 week Average home BPs   Using medication without problems or lightheadedness No chest pain with exertion or shortness of breath No Edema  Elevated Cholesterol Not taking Atorvastatin.  Pt is OK with being put back on it.  No Muscle aches  Diet: Exercise: States it is "getting better."  RLS A problems when trying to go to sleep  Relevant past medical, surgical, family and social history reviewed and updated as indicated. Interim medical history since our last visit reviewed. Allergies and medications reviewed and updated.  Review of Systems  Per HPI unless specifically indicated above     Objective:    BP (!) 163/106 (BP Location: Left Arm, Cuff Size: Normal)   Pulse 99   Temp 98.4 F (36.9 C)   Ht 5' 4.5" (1.638 m)   Wt 155 lb 1.6 oz (70.4 kg)   LMP 03/18/2017 (Exact Date)   SpO2 98%   BMI 26.21 kg/m   Wt Readings from Last 3 Encounters:  03/19/17 155 lb 1.6 oz (70.4 kg)  10/13/16 162 lb 9.6 oz (73.8 kg)  09/15/16 164 lb 9.6 oz (74.7 kg)    Physical Exam  Constitutional: She is oriented to person, place, and time. She appears well-developed and well-nourished. No distress.  HENT:  Head: Normocephalic and  atraumatic.  Eyes: Conjunctivae and lids are normal. Right eye exhibits no discharge. Left eye exhibits no discharge. No scleral icterus.  Neck: Normal range of motion. Neck supple. No JVD present. Carotid bruit is not present.  Cardiovascular: Normal rate, regular rhythm and normal heart sounds.   Pulmonary/Chest: Effort normal and breath sounds normal.  Abdominal: Normal appearance. There is no splenomegaly or hepatomegaly.  Musculoskeletal: Normal range of motion.  Neurological: She is alert and oriented to person, place, and time.  Skin: Skin is warm, dry and intact. No rash noted. No pallor.  Psychiatric: She has a normal mood and affect. Her behavior is normal. Judgment and thought content normal.    Results for orders placed or performed in visit on 08/14/16  Veritor Flu A/B Waived  Result Value Ref Range   Influenza A Negative Negative   Influenza B Negative Negative  Bayer DCA Hb A1c Waived  Result Value Ref Range   Bayer DCA Hb A1c Waived 12.2 (H) <7.0 %  Comprehensive metabolic panel  Result Value Ref Range   Glucose 287 (H) 65 - 99 mg/dL   BUN 7 6 - 24 mg/dL   Creatinine, Ser 0.44 (L) 0.57 - 1.00  mg/dL   GFR calc non Af Amer 122 >59 mL/min/1.73   GFR calc Af Amer 141 >59 mL/min/1.73   BUN/Creatinine Ratio 16 9 - 23   Sodium 136 134 - 144 mmol/L   Potassium 4.4 3.5 - 5.2 mmol/L   Chloride 99 96 - 106 mmol/L   CO2 20 18 - 29 mmol/L   Calcium 8.9 8.7 - 10.2 mg/dL   Total Protein 6.4 6.0 - 8.5 g/dL   Albumin 3.6 3.5 - 5.5 g/dL   Globulin, Total 2.8 1.5 - 4.5 g/dL   Albumin/Globulin Ratio 1.3 1.2 - 2.2   Bilirubin Total 0.2 0.0 - 1.2 mg/dL   Alkaline Phosphatase 78 39 - 117 IU/L   AST 54 (H) 0 - 40 IU/L   ALT 136 (H) 0 - 32 IU/L  Lipid Panel w/o Chol/HDL Ratio  Result Value Ref Range   Cholesterol, Total 182 100 - 199 mg/dL   Triglycerides 213 (H) 0 - 149 mg/dL   HDL 28 (L) >39 mg/dL   VLDL Cholesterol Cal 43 (H) 5 - 40 mg/dL   LDL Calculated 111 (H) 0 - 99 mg/dL   CBC with Differential/Platelet  Result Value Ref Range   WBC 3.6 3.4 - 10.8 x10E3/uL   RBC 5.67 (H) 3.77 - 5.28 x10E6/uL   Hemoglobin 11.7 11.1 - 15.9 g/dL   Hematocrit 39.3 34.0 - 46.6 %   MCV 69 (L) 79 - 97 fL   MCH 20.6 (L) 26.6 - 33.0 pg   MCHC 29.8 (L) 31.5 - 35.7 g/dL   RDW 16.1 (H) 12.3 - 15.4 %   Platelets 283 150 - 379 x10E3/uL   Neutrophils 47 Not Estab. %   Lymphs 38 Not Estab. %   Monocytes 12 Not Estab. %   Eos 1 Not Estab. %   Basos 1 Not Estab. %   Neutrophils Absolute 1.7 1.4 - 7.0 x10E3/uL   Lymphocytes Absolute 1.4 0.7 - 3.1 x10E3/uL   Monocytes Absolute 0.4 0.1 - 0.9 x10E3/uL   EOS (ABSOLUTE) 0.0 0.0 - 0.4 x10E3/uL   Basophils Absolute 0.0 0.0 - 0.2 x10E3/uL   Immature Granulocytes 1 Not Estab. %   Immature Grans (Abs) 0.0 0.0 - 0.1 x10E3/uL  Microalbumin, Urine Waived  Result Value Ref Range   Microalb, Ur Waived 150 (H) 0 - 19 mg/L   Creatinine, Urine Waived 200 10 - 300 mg/dL   Microalb/Creat Ratio 30-300 (H) <30 mg/g  Uric acid  Result Value Ref Range   Uric Acid 2.4 (L) 2.5 - 7.1 mg/dL  Ferritin  Result Value Ref Range   Ferritin 25 15 - 150 ng/mL      Assessment & Plan:   Problem List Items Addressed This Visit      Unprioritized   Hyperlipidemia   Relevant Medications   metoprolol succinate (TOPROL-XL) 25 MG 24 hr tablet   atorvastatin (LIPITOR) 10 MG tablet   Other Relevant Orders   Lipid Panel w/o Chol/HDL Ratio   Hypertension - Primary    Poorly controlled.  Multiple side effects from multiple medications.  Needs ARB but also Tachycardic.  Add Metoprolol and recheck in 1 month for ARB addition.        Relevant Medications   metoprolol succinate (TOPROL-XL) 25 MG 24 hr tablet   atorvastatin (LIPITOR) 10 MG tablet   Other Relevant Orders   Comprehensive metabolic panel   Poorly controlled type 2 diabetes mellitus (HCC)    Hgb A1C is now 6.7%.  Seeing Dr. Eddie Dibbles  next month.        Relevant Medications   atorvastatin (LIPITOR) 10  MG tablet   Restless legs    Add Mirapex QHS          Follow up plan: Return in about 4 weeks (around 04/16/2017).

## 2017-03-20 ENCOUNTER — Telehealth: Payer: Self-pay | Admitting: Unknown Physician Specialty

## 2017-03-20 LAB — COMPREHENSIVE METABOLIC PANEL
ALT: 40 IU/L — ABNORMAL HIGH (ref 0–32)
AST: 30 IU/L (ref 0–40)
Albumin/Globulin Ratio: 1.7 (ref 1.2–2.2)
Albumin: 4 g/dL (ref 3.5–5.5)
Alkaline Phosphatase: 80 IU/L (ref 39–117)
BUN/Creatinine Ratio: 11 (ref 9–23)
BUN: 5 mg/dL — ABNORMAL LOW (ref 6–24)
Bilirubin Total: 0.3 mg/dL (ref 0.0–1.2)
CO2: 19 mmol/L — ABNORMAL LOW (ref 20–29)
Calcium: 8.7 mg/dL (ref 8.7–10.2)
Chloride: 105 mmol/L (ref 96–106)
Creatinine, Ser: 0.47 mg/dL — ABNORMAL LOW (ref 0.57–1.00)
GFR calc Af Amer: 138 mL/min/{1.73_m2} (ref >59)
GFR calc non Af Amer: 120 mL/min/{1.73_m2} (ref >59)
Globulin, Total: 2.3 g/dL (ref 1.5–4.5)
Glucose: 223 mg/dL — ABNORMAL HIGH (ref 65–99)
Potassium: 4.3 mmol/L (ref 3.5–5.2)
Sodium: 138 mmol/L (ref 134–144)
Total Protein: 6.3 g/dL (ref 6.0–8.5)

## 2017-03-20 LAB — LIPID PANEL W/O CHOL/HDL RATIO
Cholesterol, Total: 167 mg/dL (ref 100–199)
HDL: 45 mg/dL (ref >39)
LDL Calculated: 107 mg/dL — ABNORMAL HIGH (ref 0–99)
Triglycerides: 73 mg/dL (ref 0–149)
VLDL Cholesterol Cal: 15 mg/dL (ref 5–40)

## 2017-03-20 LAB — HGB A1C W/O EAG: Hgb A1c MFr Bld: 8.4 % — ABNORMAL HIGH (ref 4.8–5.6)

## 2017-03-20 NOTE — Progress Notes (Signed)
Patient notified of results by phone.

## 2017-03-20 NOTE — Telephone Encounter (Signed)
Left message that Hgb A1C was 8.4 and not the 6.7% the instrument at our office read yesterday.  Call for any questions.

## 2017-04-20 ENCOUNTER — Ambulatory Visit: Payer: BC Managed Care – PPO | Admitting: Unknown Physician Specialty

## 2017-05-09 ENCOUNTER — Ambulatory Visit (INDEPENDENT_AMBULATORY_CARE_PROVIDER_SITE_OTHER): Payer: BC Managed Care – PPO | Admitting: Family Medicine

## 2017-05-09 ENCOUNTER — Encounter: Payer: Self-pay | Admitting: Family Medicine

## 2017-05-09 VITALS — BP 146/93 | HR 103 | Temp 97.9°F | Wt 150.0 lb

## 2017-05-09 DIAGNOSIS — J029 Acute pharyngitis, unspecified: Secondary | ICD-10-CM | POA: Diagnosis not present

## 2017-05-09 NOTE — Patient Instructions (Signed)
Follow up as needed

## 2017-05-09 NOTE — Progress Notes (Signed)
   BP (!) 146/93   Pulse (!) 103   Temp 97.9 F (36.6 C)   Wt 150 lb (68 kg)   LMP 04/18/2017 (Approximate)   SpO2 99%   BMI 25.35 kg/m    Subjective:    Patient ID: Ellen May, female    DOB: February 15, 1971, 46 y.o.   MRN: [id number]  HPI: Ellen May is a 46 y.o. female  Chief Complaint  Patient presents with  . Sore Throat    x 1 day, had some nausea but no fever.  . Sinusitis    feels like she has alot of mucus in the back of her head, some sinus pressure and congestion. No fever. No sinus drainage.   Sore throat, nausea, anorexia, and subjective fevers x 1 day. Now having some mild sinus drainage as well but no other URI sxs. Has not been taking anything for sxs. No sick contacts.   Relevant past medical, surgical, family and social history reviewed and updated as indicated. Interim medical history since our last visit reviewed. Allergies and medications reviewed and updated.  Review of Systems  Constitutional: Positive for appetite change.  HENT: Positive for postnasal drip and sore throat.   Eyes: Negative.   Respiratory: Negative.   Cardiovascular: Negative.   Gastrointestinal: Positive for nausea. Negative for diarrhea and vomiting.  Musculoskeletal: Negative.   Neurological: Negative.   Psychiatric/Behavioral: Negative.    Per HPI unless specifically indicated above     Objective:    BP (!) 146/93   Pulse (!) 103   Temp 97.9 F (36.6 C)   Wt 150 lb (68 kg)   LMP 04/18/2017 (Approximate)   SpO2 99%   BMI 25.35 kg/m   Wt Readings from Last 3 Encounters:  05/09/17 150 lb (68 kg)  03/19/17 155 lb 1.6 oz (70.4 kg)  10/13/16 162 lb 9.6 oz (73.8 kg)    Physical Exam  Constitutional: She is oriented to person, place, and time. She appears well-developed and well-nourished. No distress.  HENT:  Head: Atraumatic.  Right Ear: External ear normal.  Left Ear: External ear normal.  Nose: Nose normal.  Oropharynx mildly erythematous and edematous  without exudates  Eyes: Pupils are equal, round, and reactive to light. Conjunctivae are normal.  Neck: Normal range of motion. Neck supple.  Cardiovascular: Normal rate and normal heart sounds.   Pulmonary/Chest: Breath sounds normal. No respiratory distress.  Abdominal: Soft. Bowel sounds are normal. There is no tenderness.  Musculoskeletal: Normal range of motion.  Neurological: She is alert and oriented to person, place, and time.  Skin: Skin is warm and dry.  Psychiatric: She has a normal mood and affect. Her behavior is normal.  Nursing note and vitals reviewed.     Assessment & Plan:   Problem List Items Addressed This Visit    None    Visit Diagnoses    Sore throat    -  Primary   Rapid strep neg, will treat symptomatically with lozenges, throat sprays, ibuprofen, tylenol. Push fluids, rest. Await cx. F/u if worsening or no improvement   Relevant Orders   Rapid strep screen (not at [facility])      Follow up plan: Return if symptoms worsen or fail to improve.

## 2017-05-11 ENCOUNTER — Telehealth: Payer: Self-pay | Admitting: Family Medicine

## 2017-05-11 MED ORDER — LIDOCAINE VISCOUS 2 % MT SOLN: 5.0000 mL | OROMUCOSAL | 0 refills | Status: DC | PRN

## 2017-05-11 NOTE — Telephone Encounter (Signed)
Culture is still pending. Sent over the viscous lidocaine, continue taking OTC pain relievers and cold medication prn. Will let her know as soon as the culture comes back

## 2017-05-11 NOTE — Telephone Encounter (Signed)
Patient notified

## 2017-05-11 NOTE — Telephone Encounter (Signed)
Routing to provider. Culture is still pending.

## 2017-05-11 NOTE — Telephone Encounter (Signed)
Patient still not feeling better. Patient called to check the stats of her strep test results. Patient also asked about the Lidocaine gargle to [facility] in [place].   Please Advise.  Thank you

## 2017-05-12 LAB — RAPID STREP SCREEN (MED CTR MEBANE ONLY): Strep Gp A Ag, IA W/Reflex: NEGATIVE

## 2017-05-12 LAB — CULTURE, GROUP A STREP: Strep A Culture: NEGATIVE

## 2017-05-14 ENCOUNTER — Telehealth: Payer: Self-pay | Admitting: Family Medicine

## 2017-05-14 ENCOUNTER — Encounter: Payer: Self-pay | Admitting: Family Medicine

## 2017-05-14 NOTE — Telephone Encounter (Signed)
Culture was negative, I had sent a letter early this morning that she should get soon in the mail

## 2017-05-14 NOTE — Telephone Encounter (Signed)
Patient notified

## 2017-05-14 NOTE — Telephone Encounter (Signed)
Routing to provider  

## 2017-05-14 NOTE — Telephone Encounter (Signed)
No answer

## 2017-05-14 NOTE — Telephone Encounter (Signed)
Copied from [place] #[id number]. Topic: Quick Communication - See Telephone Encounter >> May 14, 2017 11:22 AM Conception Chancy, NT wrote: CRM for notification. See Telephone encounter for:  05/14/17.  Pt is calling to receive her results from her throat

## 2017-06-29 ENCOUNTER — Ambulatory Visit: Payer: BC Managed Care – PPO | Admitting: Family Medicine

## 2017-06-29 ENCOUNTER — Encounter: Payer: Self-pay | Admitting: Family Medicine

## 2017-06-29 VITALS — BP 167/93 | HR 106 | Temp 97.9°F | Wt 154.0 lb

## 2017-06-29 DIAGNOSIS — J029 Acute pharyngitis, unspecified: Secondary | ICD-10-CM

## 2017-06-29 NOTE — Patient Instructions (Signed)
Follow up as needed

## 2017-06-29 NOTE — Progress Notes (Signed)
   BP (!) 167/93   Pulse (!) 106   Temp 97.9 F (36.6 C) (Oral)   Wt 154 lb (69.9 kg)   SpO2 100%   BMI 26.03 kg/m    Subjective:    Patient ID: Ellen May, female    DOB: 08-19-1970, 46 y.o.   MRN: [id number]  HPI: Ellen May is a 46 y.o. female  Chief Complaint  Patient presents with  . Scratchy throat   Patient presents today with mild scratchy throat since last night. Denies cough, ear pain, rhinorrhea, fever, chills, aches. Notes several sick contacts at work lately. Has not been trying anything OTC.   Relevant past medical, surgical, family and social history reviewed and updated as indicated. Interim medical history since our last visit reviewed. Allergies and medications reviewed and updated.  Review of Systems  Constitutional: Negative.   HENT: Positive for sore throat.   Respiratory: Negative.   Cardiovascular: Negative.   Gastrointestinal: Negative.   Genitourinary: Negative.   Musculoskeletal: Negative.   Neurological: Negative.   Psychiatric/Behavioral: Negative.    Per HPI unless specifically indicated above     Objective:    BP (!) 167/93   Pulse (!) 106   Temp 97.9 F (36.6 C) (Oral)   Wt 154 lb (69.9 kg)   SpO2 100%   BMI 26.03 kg/m   Wt Readings from Last 3 Encounters:  06/29/17 154 lb (69.9 kg)  05/09/17 150 lb (68 kg)  03/19/17 155 lb 1.6 oz (70.4 kg)    Physical Exam  Constitutional: She is oriented to person, place, and time. She appears well-developed and well-nourished. No distress.  HENT:  Head: Atraumatic.  Right Ear: External ear normal.  Left Ear: External ear normal.  Nose: Nose normal.  Mouth/Throat: Oropharynx is clear and moist. No oropharyngeal exudate.  Eyes: Conjunctivae are normal. Pupils are equal, round, and reactive to light.  Neck: Normal range of motion. Neck supple.  Cardiovascular: Normal rate, regular rhythm and normal heart sounds.  Pulmonary/Chest: Effort normal and breath sounds normal. No  respiratory distress. She has no wheezes.  Musculoskeletal: Normal range of motion.  Lymphadenopathy:    She has no cervical adenopathy.  Neurological: She is alert and oriented to person, place, and time.  Skin: Skin is warm and dry.  Psychiatric: She has a normal mood and affect. Her behavior is normal.  Nursing note and vitals reviewed.     Assessment & Plan:   Problem List Items Addressed This Visit    None    Visit Diagnoses    Sore throat    -  Primary   Rapid strep negative, suspect either from post-nasal drainage or early onset of a cold virus. Discussed OTC remedies, humidifier, etc. F/u if no improvement   Relevant Orders   Rapid Strep Screen (Not at [facility])      Follow up plan: Return if symptoms worsen or fail to improve.

## 2017-07-03 LAB — RAPID STREP SCREEN (MED CTR MEBANE ONLY): Strep Gp A Ag, IA W/Reflex: NEGATIVE

## 2017-07-03 LAB — CULTURE, GROUP A STREP: Strep A Culture: NEGATIVE

## 2017-07-04 ENCOUNTER — Encounter: Payer: Self-pay | Admitting: Family Medicine

## 2017-08-29 ENCOUNTER — Encounter: Payer: Self-pay | Admitting: Family Medicine

## 2017-08-29 ENCOUNTER — Ambulatory Visit: Payer: BC Managed Care – PPO | Admitting: Family Medicine

## 2017-08-29 VITALS — BP 162/81 | HR 105 | Temp 98.1°F | Wt 151.4 lb

## 2017-08-29 DIAGNOSIS — N390 Urinary tract infection, site not specified: Secondary | ICD-10-CM | POA: Diagnosis not present

## 2017-08-29 LAB — URINALYSIS, ROUTINE W REFLEX MICROSCOPIC
Bilirubin, UA: NEGATIVE
Nitrite, UA: POSITIVE — AB
Specific Gravity, UA: <1.005 — ABNORMAL LOW (ref 1.005–1.030)
Urobilinogen, Ur: 2 mg/dL — ABNORMAL HIGH (ref 0.2–1.0)
pH, UA: 5 (ref 5.0–7.5)

## 2017-08-29 LAB — MICROSCOPIC EXAMINATION
RBC, UA: >30 /hpf — AB (ref 0–?)
WBC, UA: >30 /hpf — AB (ref 0–?)

## 2017-08-29 MED ORDER — SULFAMETHOXAZOLE-TRIMETHOPRIM 800-160 MG PO TABS: 1.0000 | ORAL_TABLET | Freq: Two times a day (BID) | ORAL | 0 refills | Status: DC

## 2017-08-29 NOTE — Patient Instructions (Signed)
Follow up as needed

## 2017-08-29 NOTE — Progress Notes (Signed)
BP (!) 162/81 (BP Location: Right Arm, Patient Position: Sitting, Cuff Size: Normal)   Pulse (!) 105   Temp 98.1 F (36.7 C) (Oral)   Wt 151 lb 6.4 oz (68.7 kg)   SpO2 100%   BMI 25.59 kg/m    Subjective:    Patient ID: Ellen May, female    DOB: [birth year], 47 y.o.   MRN: [id number]  HPI: Ellen May is a 47 y.o. female  Chief Complaint  Patient presents with  . Dysuria    Started this morning. Patient has taken Azo. Patient states her GYN told her she gets chronic yeast infections, being treated for that.   . Urinary Urgency   Urinary frequency, dysuria, bladder spasms since 4 am this morning. Took AZO with some mild relief. Denies fevers, chills, back pain, N/V. Long hx of UTIs and recurrent yeast infections.   Relevant past medical, surgical, family and social history reviewed and updated as indicated. Interim medical history since our last visit reviewed. Allergies and medications reviewed and updated.  Review of Systems  Constitutional: Negative.   HENT: Negative.   Respiratory: Negative.   Cardiovascular: Negative.   Gastrointestinal: Negative.   Genitourinary: Positive for dysuria, flank pain and urgency.  Musculoskeletal: Negative for back pain.  Skin: Negative.   Neurological: Negative.   Psychiatric/Behavioral: Negative.    Per HPI unless specifically indicated above     Objective:    BP (!) 162/81 (BP Location: Right Arm, Patient Position: Sitting, Cuff Size: Normal)   Pulse (!) 105   Temp 98.1 F (36.7 C) (Oral)   Wt 151 lb 6.4 oz (68.7 kg)   SpO2 100%   BMI 25.59 kg/m   Wt Readings from Last 3 Encounters:  08/29/17 151 lb 6.4 oz (68.7 kg)  06/29/17 154 lb (69.9 kg)  05/09/17 150 lb (68 kg)    Physical Exam  Constitutional: She is oriented to person, place, and time. She appears well-developed and well-nourished. No distress.  HENT:  Head: Atraumatic.  Eyes: Conjunctivae are normal. Pupils are equal, round, and reactive to light.   Neck: Normal range of motion. Neck supple.  Cardiovascular: Normal rate and normal heart sounds.  Pulmonary/Chest: Effort normal and breath sounds normal. No respiratory distress.  Abdominal: Soft. Bowel sounds are normal. She exhibits no mass. There is no tenderness. There is no rebound.  Musculoskeletal: Normal range of motion. She exhibits no tenderness (No CVA tenderness).  Neurological: She is alert and oriented to person, place, and time.  Skin: Skin is warm and dry.  Psychiatric: She has a normal mood and affect. Her behavior is normal.  Nursing note and vitals reviewed.  Results for orders placed or performed in visit on 06/29/17  Rapid Strep Screen (Not at [facility])  Result Value Ref Range   Strep Gp A Ag, IA W/Reflex Negative Negative  Culture, Group A Strep  Result Value Ref Range   Strep A Culture Negative       Assessment & Plan:   Problem List Items Addressed This Visit    None    Visit Diagnoses    Acute lower UTI    -  Primary   U/A + for UTI. Will tx with bactrim, continue AZO, push fluids, start probiotic. Await cx. F/u if no improvement   Relevant Medications   fluconazole (DIFLUCAN) 150 MG tablet   sulfamethoxazole-trimethoprim (BACTRIM DS,SEPTRA DS) 800-160 MG tablet   Other Relevant Orders   UA/M w/rflx Culture, Routine  Follow up plan: Return if symptoms worsen or fail to improve.

## 2017-10-09 ENCOUNTER — Encounter: Payer: Self-pay | Admitting: Unknown Physician Specialty

## 2017-10-09 ENCOUNTER — Ambulatory Visit: Payer: BC Managed Care – PPO | Admitting: Unknown Physician Specialty

## 2017-10-09 DIAGNOSIS — E1165 Type 2 diabetes mellitus with hyperglycemia: Secondary | ICD-10-CM | POA: Diagnosis not present

## 2017-10-09 DIAGNOSIS — F341 Dysthymic disorder: Secondary | ICD-10-CM | POA: Diagnosis not present

## 2017-10-09 MED ORDER — ESCITALOPRAM OXALATE 10 MG PO TABS: 10.0000 mg | ORAL_TABLET | Freq: Every day | ORAL | 1 refills | Status: DC

## 2017-10-09 NOTE — Assessment & Plan Note (Addendum)
New problem I suspect due to multiple changes in her life.  Will start Escitalopram 10 mg daily.  Encouraged counseling which she is planning.  Pt ed on emotional management.  Encouraged exercise

## 2017-10-09 NOTE — Assessment & Plan Note (Signed)
May be contributing.  Pt states not under good control

## 2017-10-09 NOTE — Progress Notes (Signed)
BP (!) 158/98   Pulse (!) 112   Temp 98.5 F (36.9 C) (Oral)   Wt 148 lb 12.8 oz (67.5 kg)   SpO2 100%   BMI 25.15 kg/m    Subjective:    Patient ID: Ellen May, female    DOB: Nov 24, 1970, 47 y.o.   MRN: [id number]  HPI: Ellen May is a 47 y.o. female  Chief Complaint  Patient presents with  . Emotional    pt states she has been crying for the past 2 days and does not know why   Pt states she is crying for the past 2 days and doesn't know why.  She doesn't seem to be depressed. She states she is not an emotional person and "needs this to stop."  States she has an appointment with with a counselor next week.  She plans to see her gynecologist for menopausal management.   Depression screen [facility] 2/9 10/09/2017 03/19/2017  Decreased Interest 1 0  Down, Depressed, Hopeless 1 0  PHQ - 2 Score 2 0  Altered sleeping 0 2  Tired, decreased energy 1 1  Change in appetite 1 0  Feeling bad or failure about yourself  1 0  Trouble concentrating 1 0  Moving slowly or fidgety/restless 0 3  Suicidal thoughts 0 0  PHQ-9 Score 6 6    Relevant past medical, surgical, family and social history reviewed and updated as indicated. Interim medical history since our last visit reviewed. Allergies and medications reviewed and updated.  Review of Systems  Constitutional: Negative.   HENT: Negative.   Respiratory: Negative.   Cardiovascular: Negative.   Gastrointestinal: Negative.   Psychiatric/Behavioral: Negative.     Per HPI unless specifically indicated above     Objective:    BP (!) 158/98   Pulse (!) 112   Temp 98.5 F (36.9 C) (Oral)   Wt 148 lb 12.8 oz (67.5 kg)   SpO2 100%   BMI 25.15 kg/m   Wt Readings from Last 3 Encounters:  10/09/17 148 lb 12.8 oz (67.5 kg)  08/29/17 151 lb 6.4 oz (68.7 kg)  06/29/17 154 lb (69.9 kg)    Physical Exam  Constitutional: She is oriented to person, place, and time. She appears well-developed and well-nourished. No distress.    HENT:  Head: Normocephalic and atraumatic.  Eyes: Conjunctivae and lids are normal. Right eye exhibits no discharge. Left eye exhibits no discharge. No scleral icterus.  Cardiovascular: Normal rate.  Pulmonary/Chest: Effort normal.  Abdominal: Normal appearance. There is no splenomegaly or hepatomegaly.  Musculoskeletal: Normal range of motion.  Neurological: She is alert and oriented to person, place, and time.  Skin: Skin is intact. No rash noted. No pallor.  Psychiatric: She has a normal mood and affect. Her behavior is normal. Judgment and thought content normal.    Results for orders placed or performed in visit on 08/29/17  Microscopic Examination  Result Value Ref Range   WBC, UA >30 (A) 0 - 5 /hpf   RBC, UA >30 (A) 0 - 2 /hpf   Epithelial Cells (non renal) 0-10 0 - 10 /hpf   Bacteria, UA Moderate (A) None seen/Few  Urinalysis, Routine w reflex microscopic  Result Value Ref Range   Specific Gravity, UA <1.005 (L) 1.005 - 1.030   pH, UA 5.0 5.0 - 7.5   Color, UA Red (A) Yellow   Appearance Ur Turbid (A) Clear   Leukocytes, UA Trace (A) Negative   Protein, UA 2+ (  A) Negative/Trace   Glucose, UA 3+ (A) Negative   Ketones, UA 1+ (A) Negative   RBC, UA 3+ (A) Negative   Bilirubin, UA Negative Negative   Urobilinogen, Ur 2.0 (H) 0.2 - 1.0 mg/dL   Nitrite, UA Positive (A) Negative   Microscopic Examination See below:       Assessment & Plan:   Problem List Items Addressed This Visit      Unprioritized   Dysthymia    New problem I suspect due to multiple changes in her life.  Will start Escitalopram 10 mg daily.  Encouraged counseling which she is planning.  Pt ed on emotional management.  Encouraged exercise      Relevant Medications   escitalopram (LEXAPRO) 10 MG tablet   Other Relevant Orders   Thyroid Panel With TSH   Poorly controlled type 2 diabetes mellitus ([place])    May be contributing.  Pt states not under good control          Follow up plan: Return  in about 4 weeks (around 11/06/2017).

## 2017-10-11 ENCOUNTER — Other Ambulatory Visit: Payer: Self-pay | Admitting: Unknown Physician Specialty

## 2017-10-11 ENCOUNTER — Other Ambulatory Visit: Payer: Self-pay

## 2017-10-11 DIAGNOSIS — E039 Hypothyroidism, unspecified: Secondary | ICD-10-CM

## 2017-10-11 LAB — THYROID PANEL WITH TSH

## 2017-10-16 LAB — THYROID PANEL WITH TSH
Free Thyroxine Index: 1.8 (ref 1.2–4.9)
T3 Uptake Ratio: 23 % — ABNORMAL LOW (ref 24–39)
T4, Total: 7.9 ug/dL (ref 4.5–12.0)
TSH: 2.04 u[IU]/mL (ref 0.450–4.500)

## 2017-10-16 LAB — SPECIMEN STATUS REPORT

## 2017-10-28 ENCOUNTER — Other Ambulatory Visit: Payer: Self-pay | Admitting: Unknown Physician Specialty

## 2018-02-13 ENCOUNTER — Other Ambulatory Visit: Payer: Self-pay | Admitting: Unknown Physician Specialty

## 2018-02-15 ENCOUNTER — Other Ambulatory Visit: Payer: Self-pay

## 2018-02-15 MED ORDER — ESCITALOPRAM OXALATE 10 MG PO TABS: 10.0000 mg | ORAL_TABLET | Freq: Every day | ORAL | 1 refills | Status: DC

## 2018-04-08 ENCOUNTER — Other Ambulatory Visit: Payer: Self-pay

## 2018-04-08 ENCOUNTER — Encounter: Payer: Self-pay | Admitting: Family Medicine

## 2018-04-08 ENCOUNTER — Ambulatory Visit: Payer: BC Managed Care – PPO | Admitting: Family Medicine

## 2018-04-08 VITALS — BP 166/97 | HR 101 | Temp 97.8°F | Wt 153.0 lb

## 2018-04-08 DIAGNOSIS — I10 Essential (primary) hypertension: Secondary | ICD-10-CM

## 2018-04-08 DIAGNOSIS — G2581 Restless legs syndrome: Secondary | ICD-10-CM | POA: Diagnosis not present

## 2018-04-08 DIAGNOSIS — G43109 Migraine with aura, not intractable, without status migrainosus: Secondary | ICD-10-CM | POA: Diagnosis not present

## 2018-04-08 DIAGNOSIS — F341 Dysthymic disorder: Secondary | ICD-10-CM

## 2018-04-08 MED ORDER — LISINOPRIL 10 MG PO TABS: 10.0000 mg | ORAL_TABLET | Freq: Every day | ORAL | 3 refills | Status: DC

## 2018-04-08 MED ORDER — DULOXETINE HCL 20 MG PO CPEP: 20.0000 mg | ORAL_CAPSULE | Freq: Every day | ORAL | 1 refills | Status: DC

## 2018-04-08 MED ORDER — SUMATRIPTAN SUCCINATE 50 MG PO TABS: 50.0000 mg | ORAL_TABLET | ORAL | 0 refills | Status: DC | PRN

## 2018-04-08 NOTE — Progress Notes (Signed)
BP (!) 166/97   Pulse (!) 101   Temp 97.8 F (36.6 C) (Oral)   Wt 153 lb (69.4 kg)   SpO2 99%   BMI 25.86 kg/m    Subjective:    Patient ID: Ellen May, female    DOB: Dec 23, 1970, 47 y.o.   MRN: [id number]  HPI: Ellen May is a 47 y.o. female  Chief Complaint  Patient presents with  . Headache    x 1 month/ lexapro refill   Headaches x 1 month. Mild Tunnel vision, halos, and then headache intermittently for about a month. Headache isn't bad at all, just the frequency concerns her. Takes 200 mg ibuprofen which relieves it. Lasts about 1.5 hours at a time. Had one past migraine a long time ago (about 15 years ago). No fhx of migraines, aneurysms, or other neurologic issues.   BPs running high - 140s/70s at home. Denies CP, SOB, dizziness. Compliant with medications  Having bad RLS, keeping her husband awake at night. Started taking an OTC RLS supplement.   GAD 7 : Generalized Anxiety Score 04/08/2018  Nervous, Anxious, on Edge 0  Control/stop worrying 0  Worry too much - different things 0  Trouble relaxing 0  Restless 0  Easily annoyed or irritable 0  Afraid - awful might happen 0  Total GAD 7 Score 0   Depression screen [facility] 2/9 10/09/2017 03/19/2017  Decreased Interest 1 0  Down, Depressed, Hopeless 1 0  PHQ - 2 Score 2 0  Altered sleeping 0 2  Tired, decreased energy 1 1  Change in appetite 1 0  Feeling bad or failure about yourself  1 0  Trouble concentrating 1 0  Moving slowly or fidgety/restless 0 3  Suicidal thoughts 0 0  PHQ-9 Score 6 6    Relevant past medical, surgical, family and social history reviewed and updated as indicated. Interim medical history since our last visit reviewed. Allergies and medications reviewed and updated.  Review of Systems  Per HPI unless specifically indicated above     Objective:    BP (!) 166/97   Pulse (!) 101   Temp 97.8 F (36.6 C) (Oral)   Wt 153 lb (69.4 kg)   SpO2 99%   BMI 25.86 kg/m   Wt  Readings from Last 3 Encounters:  04/08/18 153 lb (69.4 kg)  10/09/17 148 lb 12.8 oz (67.5 kg)  08/29/17 151 lb 6.4 oz (68.7 kg)    Physical Exam  Constitutional: She is oriented to person, place, and time. She appears well-developed and well-nourished.  HENT:  Head: Atraumatic.  Eyes: Conjunctivae and EOM are normal.  Neck: Normal range of motion. Neck supple.  Cardiovascular: Normal rate, regular rhythm and normal heart sounds.  Pulmonary/Chest: Effort normal and breath sounds normal.  Musculoskeletal: Normal range of motion.  Neurological: She is alert and oriented to person, place, and time.  Skin: Skin is warm and dry.  Psychiatric: She has a normal mood and affect. Her behavior is normal.  Nursing note and vitals reviewed.   Results for orders placed or performed in visit on 10/11/17  Thyroid Panel With TSH  Result Value Ref Range   TSH 2.040 0.450 - 4.500 uIU/mL   T4, Total 7.9 4.5 - 12.0 ug/dL   T3 Uptake Ratio 23 (L) 24 - 39 %   Free Thyroxine Index 1.8 1.2 - 4.9  Specimen status report  Result Value Ref Range   specimen status report Comment  Assessment & Plan:   Problem List Items Addressed This Visit      Cardiovascular and Mediastinum   Hypertension - Primary    Will add lisinopril to metoprolol and follow up in 1 month. Continue home checks, work on [employer], exercise.       Relevant Medications   lisinopril (PRINIVIL,ZESTRIL) 10 MG tablet     Other   Restless legs    Will continue trying OTC supplement, also recommended iron supplement. F/u if not improving      Dysthymia    Did not note much change on lexapro, will switch to cymbalta given concurrent migraine issue and monitor closely for benefit.       Relevant Medications   DULoxetine (CYMBALTA) 20 MG capsule    Other Visit Diagnoses    Migraine with aura and without status migrainosus, not intractable       Will start cymablta to help with frequency, imitrex prn. If not significantly  improved within 1 month will consider referral/imaging    Relevant Medications   lisinopril (PRINIVIL,ZESTRIL) 10 MG tablet   SUMAtriptan (IMITREX) 50 MG tablet   DULoxetine (CYMBALTA) 20 MG capsule       Follow up plan: Return in about 4 weeks (around 05/06/2018) for BP, mood f/u.

## 2018-04-08 NOTE — Assessment & Plan Note (Signed)
Will continue trying OTC supplement, also recommended iron supplement. F/u if not improving

## 2018-04-08 NOTE — Assessment & Plan Note (Signed)
Will add lisinopril to metoprolol and follow up in 1 month. Continue home checks, work on [employer], exercise.

## 2018-04-08 NOTE — Assessment & Plan Note (Signed)
Did not note much change on lexapro, will switch to cymbalta given concurrent migraine issue and monitor closely for benefit.

## 2018-04-08 NOTE — Patient Instructions (Signed)
Follow up in 1 month   

## 2018-04-11 ENCOUNTER — Encounter: Payer: Self-pay | Admitting: Family Medicine

## 2018-05-06 ENCOUNTER — Ambulatory Visit: Payer: BC Managed Care – PPO | Admitting: Family Medicine

## 2018-05-29 ENCOUNTER — Telehealth: Payer: Self-pay | Admitting: Family Medicine

## 2018-05-29 ENCOUNTER — Ambulatory Visit: Payer: BC Managed Care – PPO | Admitting: Family Medicine

## 2018-05-29 VITALS — BP 131/89 | HR 115 | Temp 98.0°F | Wt 151.5 lb

## 2018-05-29 DIAGNOSIS — N390 Urinary tract infection, site not specified: Secondary | ICD-10-CM | POA: Diagnosis not present

## 2018-05-29 DIAGNOSIS — N760 Acute vaginitis: Secondary | ICD-10-CM | POA: Diagnosis not present

## 2018-05-29 DIAGNOSIS — B373 Candidiasis of vulva and vagina: Secondary | ICD-10-CM

## 2018-05-29 DIAGNOSIS — B9689 Other specified bacterial agents as the cause of diseases classified elsewhere: Secondary | ICD-10-CM

## 2018-05-29 DIAGNOSIS — B3731 Acute candidiasis of vulva and vagina: Secondary | ICD-10-CM

## 2018-05-29 LAB — UA/M W/RFLX CULTURE, ROUTINE
Bilirubin, UA: NEGATIVE
Nitrite, UA: POSITIVE — AB
Specific Gravity, UA: 1.01 (ref 1.005–1.030)
Urobilinogen, Ur: 0.2 mg/dL (ref 0.2–1.0)
pH, UA: 5 (ref 5.0–7.5)

## 2018-05-29 LAB — MICROSCOPIC EXAMINATION: WBC, UA: >30 /hpf — AB (ref 0–5)

## 2018-05-29 LAB — WET PREP FOR TRICH, YEAST, CLUE
Clue Cell Exam: POSITIVE — AB
Trichomonas Exam: NEGATIVE
Yeast Exam: POSITIVE — AB

## 2018-05-29 MED ORDER — METRONIDAZOLE 0.75 % VA GEL: 1.0000 | Freq: Two times a day (BID) | VAGINAL | 1 refills | Status: DC

## 2018-05-29 MED ORDER — SULFAMETHOXAZOLE-TRIMETHOPRIM 800-160 MG PO TABS: 1.0000 | ORAL_TABLET | Freq: Two times a day (BID) | ORAL | 0 refills | Status: DC

## 2018-05-29 MED ORDER — FLUCONAZOLE 150 MG PO TABS: 150.0000 mg | ORAL_TABLET | Freq: Once | ORAL | 1 refills | Status: AC

## 2018-05-29 NOTE — Progress Notes (Signed)
BP 131/89   Pulse (!) 115   Temp 98 F (36.7 C) (Oral)   Wt 151 lb 8 oz (68.7 kg)   SpO2 96%   BMI 25.60 kg/m    Subjective:    Patient ID: Ellen May, female    DOB: [birth year], 47 y.o.   MRN: [id number]  HPI: Ellen May is a 47 y.o. female  Chief Complaint  Patient presents with  . Urinary Frequency    "Gut pain" started last night. LUTS started on Friday. Did take Azo   5 days of abdominal pain, dysuria, frequency, bladder spasms, vaginal irritation. Tried AZO with temporary relief. Denies fever, nausea, vomiting, diarrhea, constipation, hematuria.   Relevant past medical, surgical, family and social history reviewed and updated as indicated. Interim medical history since our last visit reviewed. Allergies and medications reviewed and updated.  Review of Systems  Per HPI unless specifically indicated above     Objective:    BP 131/89   Pulse (!) 115   Temp 98 F (36.7 C) (Oral)   Wt 151 lb 8 oz (68.7 kg)   SpO2 96%   BMI 25.60 kg/m   Wt Readings from Last 3 Encounters:  05/29/18 151 lb 8 oz (68.7 kg)  04/08/18 153 lb (69.4 kg)  10/09/17 148 lb 12.8 oz (67.5 kg)    Physical Exam  Constitutional: She is oriented to person, place, and time. She appears well-developed and well-nourished. No distress.  HENT:  Head: Atraumatic.  Eyes: Conjunctivae and EOM are normal.  Neck: Normal range of motion. Neck supple.  Cardiovascular: Normal rate and regular rhythm.  Pulmonary/Chest: Effort normal and breath sounds normal.  Abdominal: Soft. Bowel sounds are normal. There is tenderness (mild suprapubic ttp).  Musculoskeletal: Normal range of motion. She exhibits no tenderness (No CVA ttp b/l).  Neurological: She is alert and oriented to person, place, and time.  Skin: Skin is warm and dry.  Psychiatric: She has a normal mood and affect. Her behavior is normal.  Nursing note and vitals reviewed.   Results for orders placed or performed in visit on  05/29/18  Urine Culture  Result Value Ref Range   Urine Culture, Routine Final report (A)    Organism ID, Bacteria Escherichia coli (A)    Antimicrobial Susceptibility Comment   WET PREP FOR TRICH, YEAST, CLUE  Result Value Ref Range   Trichomonas Exam Negative Negative   Yeast Exam Positive (A) Negative   Clue Cell Exam Positive (A) Negative  Microscopic Examination  Result Value Ref Range   WBC, UA >30 (A) 0 - 5 /hpf   RBC, UA 11-30 (A) 0 - 2 /hpf   Epithelial Cells (non renal) 0-10 0 - 10 /hpf   Bacteria, UA Moderate (A) None seen/Few  UA/M w/rflx Culture, Routine  Result Value Ref Range   Specific Gravity, UA 1.010 1.005 - 1.030   pH, UA 5.0 5.0 - 7.5   Color, UA Yellow Yellow   Appearance Ur Turbid (A) Clear   Leukocytes, UA Trace (A) Negative   Protein, UA 1+ (A) Negative/Trace   Glucose, UA 2+ (A) Negative   Ketones, UA 4+ (A) Negative   RBC, UA 2+ (A) Negative   Bilirubin, UA Negative Negative   Urobilinogen, Ur 0.2 0.2 - 1.0 mg/dL   Nitrite, UA Positive (A) Negative   Microscopic Examination See below:       Assessment & Plan:   Problem List Items Addressed This Visit  None    Visit Diagnoses    Acute lower UTI    -  Primary   Tx with bactrim and await urine culture. Probiotics, push fluids.    Relevant Medications   sulfamethoxazole-trimethoprim (BACTRIM DS,SEPTRA DS) 800-160 MG tablet   Other Relevant Orders   UA/M w/rflx Culture, Routine (Completed)   Urine Culture (Completed)   WET PREP FOR [place], YEAST, CLUE (Completed)   Vaginal candidiasis       Diflucan sent. Start probiotics, good vaginal hygiene reviewed   Relevant Medications   sulfamethoxazole-trimethoprim (BACTRIM DS,SEPTRA DS) 800-160 MG tablet   BV (bacterial vaginosis)       Vaginal metronidazole sent, start probiotics. Good vaginal hygiene reviewed   Relevant Medications   sulfamethoxazole-trimethoprim (BACTRIM DS,SEPTRA DS) 800-160 MG tablet       Follow up plan: Return if  symptoms worsen or fail to improve.

## 2018-05-29 NOTE — Telephone Encounter (Signed)
Called and left patient a VM (signed DPR) letting her know what Rachel said. Asked for patient to call back with any questions or concerns.  

## 2018-05-29 NOTE — Telephone Encounter (Signed)
Please let her know that her wet prep showed a yeast infection and BV, so I've sent over diflucan and some vaginal metronidazole to treat those. Continue with the bactrim for the UTI as well

## 2018-05-30 ENCOUNTER — Encounter: Payer: Self-pay | Admitting: Family Medicine

## 2018-05-31 LAB — URINE CULTURE

## 2018-06-02 NOTE — Patient Instructions (Signed)
Follow up as needed

## 2018-06-09 ENCOUNTER — Encounter: Payer: Self-pay | Admitting: Family Medicine

## 2018-06-10 ENCOUNTER — Other Ambulatory Visit: Payer: Self-pay | Admitting: Family Medicine

## 2018-06-10 MED ORDER — FLUCONAZOLE 150 MG PO TABS: 150.0000 mg | ORAL_TABLET | Freq: Once | ORAL | 0 refills | Status: AC

## 2018-06-19 ENCOUNTER — Other Ambulatory Visit: Payer: Self-pay | Admitting: Unknown Physician Specialty

## 2018-06-19 ENCOUNTER — Ambulatory Visit: Payer: Self-pay | Admitting: *Deleted

## 2018-06-19 NOTE — Telephone Encounter (Signed)
Requested medication (s) are due for refill today -yes- overdue  Requested medication (s) are on the active medication list -yes  Future visit scheduled -no  Last refill: 10/29/17  Notes to clinic: Patient needs new Rx sent to pharmacy under new provider- former [facility] patient. Patient was seen by endocrinologist and notes advise to continue this medication. Sent for provider review and refill.  Requested Prescriptions  Pending Prescriptions Disp Refills   JARDIANCE 25 MG TABS tablet [Pharmacy Med Name: JARDIANCE 25 MG TABLET] 30 tablet 2    Sig: TAKE ONE TABLET BY MOUTH DAILY     Endocrinology:  Diabetes - SGLT2 Inhibitors Failed - 06/19/2018 11:08 AM      Failed - Cr in normal range and within 360 days    Creatinine, Ser  Date Value Ref Range Status  03/19/2017 0.47 (L) 0.57 - 1.00 mg/dL Final         Failed - LDL in normal range and within 360 days    LDL Calculated  Date Value Ref Range Status  03/19/2017 107 (H) 0 - 99 mg/dL Final         Failed - HBA1C is between 0 and 7.9 and within 180 days    Hgb A1c MFr Bld  Date Value Ref Range Status  03/19/2017 8.4 (H) 4.8 - 5.6 % Final    Comment:             Prediabetes: 5.7 - 6.4          Diabetes: >6.4          Glycemic control for adults with diabetes: <7.0          Failed - eGFR in normal range and within 360 days    GFR calc Af Amer  Date Value Ref Range Status  03/19/2017 138 >59 mL/min/1.73 Final   GFR calc non Af Amer  Date Value Ref Range Status  03/19/2017 120 >59 mL/min/1.73 Final         Passed - Valid encounter within last 6 months    Recent Outpatient Visits          3 weeks ago Acute lower UTI   [facility] Volney American, PA-C   2 months ago Essential hypertension   [place], [place], [place]   8 months ago [place], Cheryl, NP   9 months ago Acute lower UTI   [facility] Merrie Roof [place], [place]    11 months ago Sore throat   [facility] Merrie Roof [place], [place]              Requested Prescriptions  Pending Prescriptions Disp Refills   JARDIANCE 25 MG TABS tablet [Pharmacy Med Name: JARDIANCE 25 MG TABLET] 30 tablet 2    Sig: TAKE ONE TABLET BY MOUTH DAILY     Endocrinology:  Diabetes - SGLT2 Inhibitors Failed - 06/19/2018 11:08 AM      Failed - Cr in normal range and within 360 days    Creatinine, Ser  Date Value Ref Range Status  03/19/2017 0.47 (L) 0.57 - 1.00 mg/dL Final         Failed - LDL in normal range and within 360 days    LDL Calculated  Date Value Ref Range Status  03/19/2017 107 (H) 0 - 99 mg/dL Final         Failed - HBA1C is between 0 and 7.9 and within 180 days  Hgb A1c MFr Bld  Date Value Ref Range Status  03/19/2017 8.4 (H) 4.8 - 5.6 % Final    Comment:             Prediabetes: 5.7 - 6.4          Diabetes: >6.4          Glycemic control for adults with diabetes: <7.0          Failed - eGFR in normal range and within 360 days    GFR calc Af Amer  Date Value Ref Range Status  03/19/2017 138 >59 mL/min/1.73 Final   GFR calc non Af Amer  Date Value Ref Range Status  03/19/2017 120 >59 mL/min/1.73 Final         Passed - Valid encounter within last 6 months    Recent Outpatient Visits          3 weeks ago Acute lower UTI   [facility] Volney American, [place]   2 months ago Essential hypertension   [place], [place], [place]   8 months ago [place], NP   9 months ago Acute lower UTI   [place], [place]   11 months ago Sore throat   [facility] Merrie Roof [place], [place]

## 2018-06-19 NOTE — Telephone Encounter (Signed)
Please let her know this is refilled, but needs upcoming appointment scheduled for diabetes check for next refill.

## 2018-06-19 NOTE — Telephone Encounter (Signed)
Patient called in and says "I am calling to ask how long does a virus last. I went to a [facility] and was told I have a virus. I had a fever, nasal congestion, cough. I was given cough syrup and an antibiotic on hold to pick up Friday, if I need it." I advised a virus can last up to 7 days and sometimes longer. She verbalized understanding.   Reason for Disposition . Health Information question, no triage required and triager able to answer question  Protocols used: INFORMATION ONLY CALL-A-AH

## 2018-07-05 ENCOUNTER — Ambulatory Visit: Payer: BC Managed Care – PPO | Admitting: Family Medicine

## 2018-07-24 DIAGNOSIS — U071 COVID-19: Secondary | ICD-10-CM

## 2018-07-24 HISTORY — DX: COVID-19: U07.1

## 2018-09-06 ENCOUNTER — Ambulatory Visit: Payer: Self-pay | Admitting: *Deleted

## 2018-09-06 MED ORDER — FLUCONAZOLE 150 MG PO TABS: 150.0000 mg | ORAL_TABLET | Freq: Once | ORAL | 0 refills | Status: AC

## 2018-09-06 NOTE — Telephone Encounter (Signed)
Rx sent, she will need to come get checked out if not improving

## 2018-09-06 NOTE — Telephone Encounter (Signed)
Patient notified

## 2018-09-06 NOTE — Telephone Encounter (Signed)
Contacted pt regarding symptoms; the pt says that she is having symptoms of a yeast infection which started 2 weeks ago; she has tried OTC medication and left over metronidazole and fluconazole a week ago but it did not help; she reports vaginal itching/burning; the pt says that she has been diagnosed with chronic yeast infections; recommendations made per nurse triage protocol; pt unable to be seen in office on the afternoon of 09/06/2018, or the morning of 09/10/2018; pt offered and accepted appointment with Merrie Roof, [facility], 09/10/2018 at 1430; she verbalizes understanding; in the event that the provider can call something in the pt uses [employer], [place]; she can be contacted at [phone]; a message can be left on voicemail; contacted Laural Roes; will route to office for final disposition.  Reason for Disposition . [1] Symptoms of a yeast infection (i.e., itchy, white discharge, not bad smelling) AND    [2] not improved > 3 days following CARE ADVICE  Answer Assessment - Initial Assessment Questions 1. SYMPTOM: "What's the main symptom you're concerned about?" (e.g., pain, itching, dryness)     Vaginal itching 2. LOCATION: "Where is the located?" (e.g., inside/outside, left/right)     Inside and out on both sides 3. ONSET: "When did the    start?"     08/23/2018 4. PAIN: "Is there any pain?" If so, ask: "How bad is it?" (Scale: 1-10; mild, moderate, severe)     Tender hard to sit at times; rated 3-4 out of 10 5. ITCHING: "Is there any itching?" If so, ask: "How bad is it?" (Scale: 1-10; mild, moderate, severe)     Rated 25 out of 10 6. CAUSE: "What do you think is causing the discharge?" "Have you had the same problem before? What happened then?"     Chronic yeast infections 7. OTHER SYMPTOMS: "Do you have any other symptoms?" (e.g., fever, itching, vaginal bleeding, pain with urination, injury to genital area, vaginal foreign body)     no 8. PREGNANCY: "Is there  any chance you are pregnant?" "When was your last menstrual period?"     No LMP around 08/06/2018  Protocols used: VAGINAL [facility]

## 2018-09-06 NOTE — Addendum Note (Signed)
Addended by: Merrie Roof E on: 09/06/2018 03:56 PM   Modules accepted: Orders

## 2018-09-09 ENCOUNTER — Ambulatory Visit: Payer: BC Managed Care – PPO | Admitting: Family Medicine

## 2018-10-09 ENCOUNTER — Ambulatory Visit: Payer: BC Managed Care – PPO | Admitting: Nurse Practitioner

## 2018-10-09 ENCOUNTER — Encounter: Payer: Self-pay | Admitting: Nurse Practitioner

## 2018-10-09 ENCOUNTER — Other Ambulatory Visit: Payer: Self-pay

## 2018-10-09 ENCOUNTER — Encounter: Payer: Self-pay | Admitting: Family Medicine

## 2018-10-09 VITALS — BP 152/80 | HR 111 | Temp 98.2°F

## 2018-10-09 DIAGNOSIS — J0110 Acute frontal sinusitis, unspecified: Secondary | ICD-10-CM

## 2018-10-09 MED ORDER — GUAIFENESIN-DM 100-10 MG/5ML PO SYRP: 5.0000 mL | ORAL_SOLUTION | ORAL | 0 refills | Status: DC | PRN

## 2018-10-09 MED ORDER — AMOXICILLIN-POT CLAVULANATE 875-125 MG PO TABS: 1.0000 | ORAL_TABLET | Freq: Two times a day (BID) | ORAL | 0 refills | Status: AC

## 2018-10-09 NOTE — Progress Notes (Signed)
BP (!) 152/80   Pulse (!) 111   Temp 98.2 F (36.8 C) (Oral)   SpO2 99%    Subjective:    Patient ID: Ellen May, female    DOB: April 12, 1971, 48 y.o.   MRN: [id number]  HPI: Ellen May is a 48 y.o. female  Chief Complaint  Patient presents with  . Cough    x 1 day  . Headache  . Sinusitis  BP elevated today. Reports white coat syndrome with chronically elevated BP at visits, but BP WNL (120's/70's) at home. Recommended monitor BP at home.   UPPER RESPIRATORY TRACT INFECTION Reports returning to work a week ago Monday from 8 week parental leave after adopting daughter. Endorses sinus pressure, headache, and ear pain starting over the weekend with cough and sore throat starting yesterday. States; "Feels like when I am getting a sinus infection".  Worst symptom: Sore throat- "dry and raw from coughing". Pt reports Dextromethorphan/Guaifenesin cough syrup script from last fall was helpful for cough and requests a refill for this. Recommend close monitoring of blood sugar while ill and taking cough medication. Encouraged increased hydration, humidified air, and rest for the remainder of the week.  Fever: not taking- "I don't think so" Cough: yes- dry Shortness of breath: no Wheezing: no Chest pain: yes, with cough Chest tightness: no Chest congestion: no Nasal congestion: yes Runny nose: yes Post nasal drip: yes Sneezing: no Sore throat: yes Swollen glands: yes Sinus pressure: yes Headache: yes Face pain: yes Toothache: no Ear pain: no  Ear pressure: yes bilateral Eyes red/itching:no Eye drainage/crusting: no  Vomiting: no Rash: no Fatigue: no Sick contacts: yes - 2 sisters with the flu, grand daughter with flu and ear infection, and several co-workers with flu-like symptoms Strep contacts: none known  Context: worse  Recurrent sinusitis: no Relief with OTC cold/cough medications: no - Coricidin HBP tried and not helpful Treatments attempted: vaporizor  and steam- helpful for sinus pressure, Dextromethorphan/Guaifenesin syrup- helpful   Relevant past medical, surgical, family and social history reviewed and updated as indicated. Interim medical history since our last visit reviewed. Allergies and medications reviewed and updated.  Review of Systems Per HPI unless specifically indicated above     Objective:    BP (!) 152/80   Pulse (!) 111   Temp 98.2 F (36.8 C) (Oral)   SpO2 99%   Wt Readings from Last 3 Encounters:  05/29/18 151 lb 8 oz (68.7 kg)  04/08/18 153 lb (69.4 kg)  10/09/17 148 lb 12.8 oz (67.5 kg)    Physical Exam Vitals signs and nursing note reviewed.  Constitutional:      Appearance: She is well-developed.  HENT:     Head: Normocephalic.     Right Ear: Hearing normal. A middle ear effusion is present. Tympanic membrane is bulging.     Left Ear: Hearing normal. A middle ear effusion is present. Tympanic membrane is bulging.     Nose: Septal deviation, nasal tenderness, mucosal edema, congestion and rhinorrhea present.     Right Sinus: Frontal sinus tenderness present. No maxillary sinus tenderness.     Left Sinus: Frontal sinus tenderness present. No maxillary sinus tenderness.     Mouth/Throat:     Mouth: Mucous membranes are moist.     Pharynx: Pharyngeal swelling and posterior oropharyngeal erythema present.     Tonsils: No tonsillar exudate. Swelling: 2+ on the right. 1+ on the left.  Eyes:     General:  Right eye: No discharge.        Left eye: No discharge.     Conjunctiva/sclera: Conjunctivae normal.     Pupils: Pupils are equal, round, and reactive to light.  Neck:     Musculoskeletal: Normal range of motion and neck supple.     Thyroid: No thyromegaly.     Vascular: No carotid bruit or JVD.  Cardiovascular:     Rate and Rhythm: Normal rate and regular rhythm.     Heart sounds: Normal heart sounds.  Pulmonary:     Effort: Pulmonary effort is normal.     Breath sounds: Normal breath  sounds.  Abdominal:     General: Bowel sounds are normal.     Palpations: Abdomen is soft.  Lymphadenopathy:     Cervical: Cervical adenopathy present.  Skin:    General: Skin is warm and dry.  Neurological:     Mental Status: She is alert and oriented to person, place, and time.  Psychiatric:        Behavior: Behavior normal.        Thought Content: Thought content normal.        Judgment: Judgment normal.     Results for orders placed or performed in visit on 05/29/18  Urine Culture  Result Value Ref Range   Urine Culture, Routine Final report (A)    Organism ID, Bacteria Escherichia coli (A)    Antimicrobial Susceptibility Comment   WET PREP FOR TRICH, YEAST, CLUE  Result Value Ref Range   Trichomonas Exam Negative Negative   Yeast Exam Positive (A) Negative   Clue Cell Exam Positive (A) Negative  Microscopic Examination  Result Value Ref Range   WBC, UA >30 (A) 0 - 5 /hpf   RBC, UA 11-30 (A) 0 - 2 /hpf   Epithelial Cells (non renal) 0-10 0 - 10 /hpf   Bacteria, UA Moderate (A) None seen/Few  UA/M w/rflx Culture, Routine  Result Value Ref Range   Specific Gravity, UA 1.010 1.005 - 1.030   pH, UA 5.0 5.0 - 7.5   Color, UA Yellow Yellow   Appearance Ur Turbid (A) Clear   Leukocytes, UA Trace (A) Negative   Protein, UA 1+ (A) Negative/Trace   Glucose, UA 2+ (A) Negative   Ketones, UA 4+ (A) Negative   RBC, UA 2+ (A) Negative   Bilirubin, UA Negative Negative   Urobilinogen, Ur 0.2 0.2 - 1.0 mg/dL   Nitrite, UA Positive (A) Negative   Microscopic Examination See below:       Assessment & Plan:   Problem List Items Addressed This Visit    None    Visit Diagnoses    Acute non-recurrent frontal sinusitis    -  Primary   Augmentin and MucinexDM syrup script. Flu and Strep negative. Pt to monitor blood sugar & BP closely. Follow-up if sx worsen or fail to improve.    Relevant Medications   amoxicillin-clavulanate (AUGMENTIN) 875-125 MG tablet    guaiFENesin-dextromethorphan (ROBITUSSIN DM) 100-10 MG/5ML syrup   Other Relevant Orders   Veritor Flu A/B Waived   Rapid Strep Screen ([facility] ONLY)       Follow up plan: Return if symptoms worsen or fail to improve.   NOTE WRITTEN BY [employer] DNP STUDENT.  ASSESSMENT AND PLAN OF CARE REVIEWED WITH STUDENT, AGREE WITH ABOVE FINDINGS AND PLAN.

## 2018-10-09 NOTE — Patient Instructions (Addendum)
Monitor your blood sugars closely while sick. Be sure to focus on staying well hydrated.   Sinusitis, Adult Sinusitis is soreness and swelling (inflammation) of your sinuses. Sinuses are hollow spaces in the bones around your face. They are located:  Around your eyes.  In the middle of your forehead.  Behind your nose.  In your cheekbones. Your sinuses and nasal passages are lined with a fluid called mucus. Mucus drains out of your sinuses. Swelling can trap mucus in your sinuses. This lets germs (bacteria, virus, or fungus) grow, which leads to infection. Most of the time, this condition is caused by a virus. What are the causes? This condition is caused by:  Allergies.  Asthma.  Germs.  Things that block your nose or sinuses.  Growths in the nose (nasal polyps).  Chemicals or irritants in the air.  Fungus (rare). What increases the risk? You are more likely to develop this condition if:  You have a weak body defense system (immune system).  You do a lot of swimming or diving.  You use nasal sprays too much.  You smoke. What are the signs or symptoms? The main symptoms of this condition are pain and a feeling of pressure around the sinuses. Other symptoms include:  Stuffy nose (congestion).  Runny nose (drainage).  Swelling and warmth in the sinuses.  Headache.  Toothache.  A cough that may get worse at night.  Mucus that collects in the throat or the back of the nose (postnasal drip).  Being unable to smell and taste.  Being very tired (fatigue).  A fever.  Sore throat.  Bad breath. How is this diagnosed? This condition is diagnosed based on:  Your symptoms.  Your medical history.  A physical exam.  Tests to find out if your condition is short-term (acute) or long-term (chronic). Your doctor may: ? Check your nose for growths (polyps). ? Check your sinuses using a tool that has a light (endoscope). ? Check for allergies or germs. ? Do  imaging tests, such as an MRI or CT scan. How is this treated? Treatment for this condition depends on the cause and whether it is short-term or long-term.  If caused by a virus, your symptoms should go away on their own within 10 days. You may be given medicines to relieve symptoms. They include: ? Medicines that shrink swollen tissue in the nose. ? Medicines that treat allergies (antihistamines). ? A spray that treats swelling of the nostrils. ? Rinses that help get rid of thick mucus in your nose (nasal saline washes).  If caused by bacteria, your doctor may wait to see if you will get better without treatment. You may be given antibiotic medicine if you have: ? A very bad infection. ? A weak body defense system.  If caused by growths in the nose, you may need to have surgery. Follow these instructions at home: Medicines  Take, use, or apply over-the-counter and prescription medicines only as told by your doctor. These may include nasal sprays.  If you were prescribed an antibiotic medicine, take it as told by your doctor. Do not stop taking the antibiotic even if you start to feel better. Hydrate and humidify   Drink enough water to keep your pee (urine) pale yellow.  Use a cool mist humidifier to keep the humidity level in your home above 50%.  Breathe in steam for 10-15 minutes, 3-4 times a day, or as told by your doctor. You can do this in the bathroom  while a hot shower is running.  Try not to spend time in cool or dry air. Rest  Rest as much as you can.  Sleep with your head raised (elevated).  Make sure you get enough sleep each night. General instructions   Put a warm, moist washcloth on your face 3-4 times a day, or as often as told by your doctor. This will help with discomfort.  Wash your hands often with soap and water. If there is no soap and water, use hand sanitizer.  Do not smoke. Avoid being around people who are smoking (secondhand smoke).  Keep  all follow-up visits as told by your doctor. This is important. Contact a doctor if:  You have a fever.  Your symptoms get worse.  Your symptoms do not get better within 10 days. Get help right away if:  You have a very bad headache.  You cannot stop throwing up (vomiting).  You have very bad pain or swelling around your face or eyes.  You have trouble seeing.  You feel confused.  Your neck is stiff.  You have trouble breathing. Summary  Sinusitis is swelling of your sinuses. Sinuses are hollow spaces in the bones around your face.  This condition is caused by tissues in your nose that become inflamed or swollen. This traps germs. These can lead to infection.  If you were prescribed an antibiotic medicine, take it as told by your doctor. Do not stop taking it even if you start to feel better.  Keep all follow-up visits as told by your doctor. This is important. This information is not intended to replace advice given to you by your health care provider. Make sure you discuss any questions you have with your health care provider. Document Released: 12/27/2007 Document Revised: 12/10/2017 Document Reviewed: 12/10/2017 Elsevier Interactive Patient Education  2019 [employer].

## 2018-10-12 LAB — RAPID STREP SCREEN (MED CTR MEBANE ONLY): Strep Gp A Ag, IA W/Reflex: NEGATIVE

## 2018-10-12 LAB — CULTURE, GROUP A STREP

## 2018-10-12 LAB — VERITOR FLU A/B WAIVED
Influenza A: NEGATIVE
Influenza B: NEGATIVE

## 2019-01-27 ENCOUNTER — Encounter: Payer: Self-pay | Admitting: Family Medicine

## 2019-01-27 ENCOUNTER — Telehealth: Payer: Self-pay | Admitting: *Deleted

## 2019-01-27 ENCOUNTER — Other Ambulatory Visit: Payer: Self-pay

## 2019-01-27 ENCOUNTER — Ambulatory Visit (INDEPENDENT_AMBULATORY_CARE_PROVIDER_SITE_OTHER): Payer: BC Managed Care – PPO | Admitting: Family Medicine

## 2019-01-27 DIAGNOSIS — Z20822 Contact with and (suspected) exposure to covid-19: Secondary | ICD-10-CM

## 2019-01-27 DIAGNOSIS — J029 Acute pharyngitis, unspecified: Secondary | ICD-10-CM

## 2019-01-27 DIAGNOSIS — J3489 Other specified disorders of nose and nasal sinuses: Secondary | ICD-10-CM | POA: Diagnosis not present

## 2019-01-27 NOTE — Telephone Encounter (Signed)
-----   Message from [facility], [place] sent at 01/27/2019 11:57 AM EDT ----- Regarding: COVID 19 testing Patient woke up this morning with scratchy throat, sinus drainage, fatigue. Has been around sick contacts (unclear if positive COVID 19).  [place]

## 2019-01-27 NOTE — Progress Notes (Signed)
There were no vitals taken for this visit.   Subjective:    Patient ID: Ellen May, female    DOB: 1970-09-30, 48 y.o.   MRN: [id number]  HPI: Ellen May is a 48 y.o. female  Chief Complaint  Patient presents with  . Sore Throat    Began this AM. Patient worried about whether she should be at work and be tested. Patient was tested for COVID19 around June 17th and came back negative.  . Nasal Congestion  . Sinusitis    . This visit was completed via WebEx due to the restrictions of the COVID-19 pandemic. All issues as above were discussed and addressed. Physical exam was done as above through visual confirmation on WebEx. If it was felt that the patient should be evaluated in the office, they were directed there. The patient verbally consented to this visit. . Location of the patient: work . Location of the provider: home . Those involved with this call:  . Provider: Merrie Roof, PA-C . CMA: Merilyn Baba, [place] . Front Desk/Registration: Jill Side  . Time spent on call: 15 minutes with patient face to face via video conference. More than 50% of this time was spent in counseling and coordination of care. 5 minutes total spent in review of patient's record and preparation of their chart. I verified patient identity using two factors (patient name and date of birth). Patient consents verbally to being seen via telemedicine visit today.   1 day of scratchy throat, sinus drainage and pressure, malaise. Concerned about whether she should be working or not and if she needs to be COVID 19 tested. Does not know of any COVID positive contacts, no recent travel, no fevers, chills, body aches. Has not taken anything OTC for sxs.   Relevant past medical, surgical, family and social history reviewed and updated as indicated. Interim medical history since our last visit reviewed. Allergies and medications reviewed and updated.  Review of Systems  Per HPI unless specifically  indicated above     Objective:    There were no vitals taken for this visit.  Wt Readings from Last 3 Encounters:  05/29/18 151 lb 8 oz (68.7 kg)  04/08/18 153 lb (69.4 kg)  10/09/17 148 lb 12.8 oz (67.5 kg)    Physical Exam Vitals signs and nursing note reviewed.  Constitutional:      General: She is not in acute distress.    Appearance: Normal appearance.  HENT:     Head: Atraumatic.     Right Ear: External ear normal.     Left Ear: External ear normal.     Nose: No congestion.     Comments: Nasal mucosa erythematous     Mouth/Throat:     Mouth: Mucous membranes are moist.     Pharynx: Oropharynx is clear. No posterior oropharyngeal erythema.  Eyes:     Extraocular Movements: Extraocular movements intact.     Conjunctiva/sclera: Conjunctivae normal.  Neck:     Musculoskeletal: Normal range of motion.  Cardiovascular:     Comments: Unable to assess via virtual visit Pulmonary:     Effort: Pulmonary effort is normal. No respiratory distress.  Musculoskeletal: Normal range of motion.  Skin:    General: Skin is dry.     Findings: No erythema.  Neurological:     Mental Status: She is alert and oriented to person, place, and time.  Psychiatric:        Mood and Affect: Mood normal.  Thought Content: Thought content normal.        Judgment: Judgment normal.     Results for orders placed or performed in visit on 10/09/18  Rapid Strep Screen ([facility] ONLY)   Specimen: Nasal Swab   NASAL SWAB  Result Value Ref Range   Strep Gp A Ag, IA W/Reflex Negative Negative  Culture, Group A Strep   NASAL SWAB  Result Value Ref Range   Strep A Culture Comment (A)   Veritor Flu A/B Waived  Result Value Ref Range   Influenza A Negative Negative   Influenza B Negative Negative      Assessment & Plan:   Problem List Items Addressed This Visit    None    Visit Diagnoses    Sore throat    -  Primary   Sinus drainage         Will err on the side of caution  and quarantine and test for COVID 19. Await results. Flonase, sudafed, dayquil and nyquil reviewed for home care. F/u if sxs worsening or not improving   Follow up plan: Return if symptoms worsen or fail to improve.

## 2019-01-27 NOTE — Telephone Encounter (Signed)
Pt scheduled for covid testing 01/28/19 @ [facility] @ 10:15. Instructions given and order placed

## 2019-01-28 ENCOUNTER — Other Ambulatory Visit: Payer: Self-pay

## 2019-01-28 DIAGNOSIS — Z20822 Contact with and (suspected) exposure to covid-19: Secondary | ICD-10-CM

## 2019-02-02 LAB — NOVEL CORONAVIRUS, NAA: SARS-CoV-2, NAA: NOT DETECTED

## 2019-02-03 ENCOUNTER — Encounter: Payer: Self-pay | Admitting: Family Medicine

## 2019-07-21 ENCOUNTER — Encounter: Payer: Self-pay | Admitting: Family Medicine

## 2019-07-22 ENCOUNTER — Encounter: Payer: Self-pay | Admitting: Nurse Practitioner

## 2019-07-22 ENCOUNTER — Ambulatory Visit: Payer: BC Managed Care – PPO | Attending: Internal Medicine

## 2019-07-22 ENCOUNTER — Ambulatory Visit (INDEPENDENT_AMBULATORY_CARE_PROVIDER_SITE_OTHER): Payer: BC Managed Care – PPO | Admitting: Nurse Practitioner

## 2019-07-22 ENCOUNTER — Telehealth: Payer: Self-pay | Admitting: Family Medicine

## 2019-07-22 ENCOUNTER — Other Ambulatory Visit: Payer: Self-pay

## 2019-07-22 VITALS — Temp 102.0°F

## 2019-07-22 DIAGNOSIS — R05 Cough: Secondary | ICD-10-CM | POA: Diagnosis not present

## 2019-07-22 DIAGNOSIS — J069 Acute upper respiratory infection, unspecified: Secondary | ICD-10-CM

## 2019-07-22 DIAGNOSIS — Z20822 Contact with and (suspected) exposure to covid-19: Secondary | ICD-10-CM

## 2019-07-22 DIAGNOSIS — Z20828 Contact with and (suspected) exposure to other viral communicable diseases: Secondary | ICD-10-CM | POA: Diagnosis not present

## 2019-07-22 DIAGNOSIS — R059 Cough, unspecified: Secondary | ICD-10-CM | POA: Insufficient documentation

## 2019-07-22 MED ORDER — BENZONATATE 100 MG PO CAPS: 100.0000 mg | ORAL_CAPSULE | Freq: Two times a day (BID) | ORAL | 0 refills | Status: DC | PRN

## 2019-07-22 MED ORDER — HYDROCOD POLST-CPM POLST ER 10-8 MG/5ML PO SUER: 5.0000 mL | Freq: Two times a day (BID) | ORAL | 0 refills | Status: DC | PRN

## 2019-07-22 MED ORDER — GUAIFENESIN-DM 100-10 MG/5ML PO SYRP: 5.0000 mL | ORAL_SOLUTION | ORAL | 0 refills | Status: DC | PRN

## 2019-07-22 NOTE — Telephone Encounter (Signed)
Controlled substance.  Checked data base and no other refills of controlled substances noted since April 2020.  Will send in Tussionex for patient, however she has not had A1C performed since October 2019 with endo and needs follow-up for this with either endo or PCP.

## 2019-07-22 NOTE — Progress Notes (Signed)
Temp (!) 102 F (38.9 C)   LMP 06/23/2019 (Approximate)    Subjective:    Patient ID: Ellen May, female    DOB: Feb 07, 1971, 48 y.o.   MRN: [id number]  HPI: Ellen May is a 48 y.o. female  Chief Complaint  Patient presents with  . Cough  . Fever  . Chills  . Fatigue    UPPER RESPIRATORY TRACT INFECTION Onset 12/26 Worst symptom: cough Fever: yes, as high as 102.5 Cough: yes, dry cough Shortness of breath: no Wheezing: no  Chest pain: no Chest tightness: no Chest congestion: no Nasal congestion: no Runny nose: no Post nasal drip: no Sneezing: no Sore throat: no Swollen glands: no Sinus pressure: no Headache: no Face pain: no Toothache: no Ear pain: no  Ear pressure: no  Eyes red/itching:no Eye drainage/crusting: no  Vomiting: no Rash: no Fatigue: yes  Loss of appetite: yes Loss of sense of taste/small: no Sick contacts: no, husband and daughter have developed symptoms today Strep contacts: no  Context: fluctuating  Worsening at night: yes Recurrent sinusitis: no Relief with OTC cold/cough medications: yes; Ibuprofen  Relevant past medical, surgical, family and social history reviewed and updated as indicated. Interim medical history since our last visit reviewed. Allergies and medications reviewed and updated.  Review of Systems  Constitutional: Positive for appetite change, chills, fatigue and fever. Negative for activity change.  HENT: Negative for congestion, ear pain, facial swelling, hearing loss, postnasal drip, rhinorrhea, sinus pressure, sinus pain and sore throat.   Eyes: Negative for pain, redness and itching.  Respiratory: Positive for cough (dry). Negative for chest tightness, shortness of breath and wheezing.   Cardiovascular: Negative for chest pain and palpitations.  Gastrointestinal: Negative for constipation, diarrhea, nausea and vomiting.  Musculoskeletal: Negative for arthralgias, back pain and neck pain.  Skin:  Negative for color change, pallor and rash.  Neurological: Negative for dizziness, weakness and headaches.  Hematological: Negative for adenopathy.  Psychiatric/Behavioral: Negative for confusion. The patient is not nervous/anxious.     Per HPI unless specifically indicated above     Objective:    Temp (!) 102 F (38.9 C)   LMP 06/23/2019 (Approximate)   Wt Readings from Last 3 Encounters:  05/29/18 151 lb 8 oz (68.7 kg)  04/08/18 153 lb (69.4 kg)  10/09/17 148 lb 12.8 oz (67.5 kg)    Physical Exam  Physical exam was unable to be performed due to lack of equipment.  Results for orders placed or performed in visit on 01/28/19  Novel Coronavirus, NAA ([facility])  Result Value Ref Range   SARS-CoV-2, NAA Not Detected Not Detected      Assessment & Plan:   Problem List Items Addressed This Visit      Respiratory   Upper respiratory infection, viral - Primary    Given symptoms started less than 1 week ago, likely viral etiology. Tested for covid-19 today - result is pending. Encouraged to self-quarantine with family. Drink plenty of fluids, get rest, and take Tylenol for fever. Call office with no improvement by end of week. Go to ED with any shortness of breath or chest pain.        Other   Cough in adult    Rx for cough sent to pharmacy. Tested for covid-19 today - result is pending. Encouraged to self-quarantine with family. Drink plenty of fluids, get rest, and take Tylenol for fever. Go to ED with any shortness of breath or chest pain.  Relevant Medications   guaiFENesin-dextromethorphan (ROBITUSSIN DM) 100-10 MG/5ML syrup   benzonatate (TESSALON) 100 MG capsule   Suspected COVID-19 virus infection    Tested for covid-19 today - result is pending. Encouraged to self-quarantine with family. Drink plenty of fluids, get rest, and take Tylenol for fever. Go to ED with any shortness of breath or chest pain.          Follow up plan: Return for if symptoms persist or  worsen.    This visit was completed via telephone due to the restrictions of the COVID-19 pandemic. All issues as above were discussed and addressed but no physical exam was performed. If it was felt that the patient should be evaluated in the office, they were directed there. The patient verbally consented to this visit. Patient was unable to complete an audio/visual visit due to Lack of equipment. . Location of the patient: home . Location of the provider: work . Those involved with this call:  . Provider: Carnella Guadalajara, DNP . CMA: Lauretta Grill, CMA . Front Desk/Registration: Don Perking  . Time spent on call: 10 minutes on the phone discussing health concerns. 30 minutes total spent in review of patient's record and preparation of their chart.

## 2019-07-22 NOTE — Assessment & Plan Note (Signed)
Tested for covid-19 today - result is pending. Encouraged to self-quarantine with family. Drink plenty of fluids, get rest, and take Tylenol for fever. Go to ED with any shortness of breath or chest pain.

## 2019-07-22 NOTE — Telephone Encounter (Signed)
Routing to provider  

## 2019-07-22 NOTE — Telephone Encounter (Signed)
Pt went to pick up medication from the pharmacy and the guaiFENesin-dextromethorphan (ROBITUSSIN DM) 100-10 MG/5ML syrup  that she received states it is non drowsy and the Pt wanted something for her cough that would help her to sleep /please advise  Pt states that the medication she picked up says [employer] brand "Adult Tussin" and it states its non drowsy

## 2019-07-22 NOTE — Assessment & Plan Note (Signed)
Rx for cough sent to pharmacy. Tested for covid-19 today - result is pending. Encouraged to self-quarantine with family. Drink plenty of fluids, get rest, and take Tylenol for fever. Go to ED with any shortness of breath or chest pain.

## 2019-07-22 NOTE — Assessment & Plan Note (Signed)
Given symptoms started less than 1 week ago, likely viral etiology. Tested for covid-19 today - result is pending. Encouraged to self-quarantine with family. Drink plenty of fluids, get rest, and take Tylenol for fever. Call office with no improvement by end of week. Go to ED with any shortness of breath or chest pain.

## 2019-07-23 LAB — NOVEL CORONAVIRUS, NAA: SARS-CoV-2, NAA: DETECTED — AB

## 2019-07-23 NOTE — Telephone Encounter (Signed)
Patient notified. States she will follow up with A1C.

## 2019-07-24 ENCOUNTER — Encounter: Payer: Self-pay | Admitting: *Deleted

## 2019-07-26 ENCOUNTER — Encounter: Payer: Self-pay | Admitting: Family Medicine

## 2019-08-21 ENCOUNTER — Encounter: Payer: Self-pay | Admitting: Family Medicine

## 2019-09-18 DIAGNOSIS — D229 Melanocytic nevi, unspecified: Secondary | ICD-10-CM

## 2019-09-18 DIAGNOSIS — D239 Other benign neoplasm of skin, unspecified: Secondary | ICD-10-CM

## 2019-09-18 HISTORY — DX: Melanocytic nevi, unspecified: D22.9

## 2019-09-18 HISTORY — DX: Other benign neoplasm of skin, unspecified: D23.9

## 2019-10-02 LAB — HEMOGLOBIN A1C: Hemoglobin A1C: 12.1

## 2019-11-04 ENCOUNTER — Other Ambulatory Visit: Payer: Self-pay | Admitting: Nurse Practitioner

## 2019-11-19 ENCOUNTER — Ambulatory Visit: Payer: BC Managed Care – PPO | Admitting: Dermatology

## 2019-11-19 ENCOUNTER — Encounter: Payer: Self-pay | Admitting: Dermatology

## 2019-11-19 ENCOUNTER — Other Ambulatory Visit: Payer: Self-pay

## 2019-11-19 DIAGNOSIS — L820 Inflamed seborrheic keratosis: Secondary | ICD-10-CM

## 2019-11-19 DIAGNOSIS — L918 Other hypertrophic disorders of the skin: Secondary | ICD-10-CM

## 2019-11-19 DIAGNOSIS — Z86018 Personal history of other benign neoplasm: Secondary | ICD-10-CM | POA: Diagnosis not present

## 2019-11-19 DIAGNOSIS — D1801 Hemangioma of skin and subcutaneous tissue: Secondary | ICD-10-CM

## 2019-11-19 NOTE — Progress Notes (Signed)
Follow-Up Visit   Subjective  Ellen May is a 49 y.o. female who presents for the following: Other (Moles of trunk and neck that she would like removed because they are irritating.).    The following portions of the chart were reviewed this encounter and updated as appropriate:  Tobacco  Allergies  Meds  Problems  Med Hx  Surg Hx  Fam Hx      Review of Systems:  No other skin or systemic complaints except as noted in HPI or Assessment and Plan.  Objective  Well appearing patient in no apparent distress; mood and affect are within normal limits.  A focused examination was performed including face, neck, trunk. Relevant physical exam findings are noted in the Assessment and Plan.  Objective  Left mid post side: Red papule.  Objective  Right Flank: Scar with no evidence of recurrence.   Objective  Left side, Neck - Anterior, Right Axilla: Erythematous pedunculated keratotic or waxy stuck-on papule or plaque.    Assessment & Plan    Acrochordons (Skin Tags) - Fleshy, skin-colored pedunculated papules - Benign appearing.  - Observe. - If desired, they can be removed with an in office procedure that is not covered by insurance. - Please call the clinic if you notice any new or changing lesions.  Hemangioma of skin Left mid post side with history of trauma and bleeding patient would like removed  Epidermal / dermal shaving - Left mid post side  Informed consent: discussed and consent obtained   Timeout: patient name, date of birth, surgical site, and procedure verified   Procedure prep:  Patient was prepped and draped in usual sterile fashion Prep type:  Isopropyl alcohol Anesthesia: the lesion was anesthetized in a standard fashion   Anesthetic:  1% lidocaine w/ epinephrine 1-100,000 buffered w/ 8.4% NaHCO3 Instrument used: flexible razor blade size 0.5 cm; margin 0.1 cm;= 0.7 cm defect.  Hemostasis achieved with: pressure, aluminum chloride and  electrodesiccation   Outcome: patient tolerated procedure well   Post-procedure details: sterile dressing applied and wound care instructions given   Dressing type: bandage and petrolatum    History of dysplastic nevus Right Flank Clear. Observe for recurrence. Call clinic for new or changing lesions.  Recommend regular skin exams, daily broad-spectrum spf 30+ sunscreen use, and photoprotection.    Observe   Inflamed seborrheic keratosis (3) Neck - Anterior; Left side; Right Axilla  Destruction of lesion - Left side Complexity: simple   Destruction method: cryotherapy   Informed consent: discussed and consent obtained   Timeout:  patient name, date of birth, surgical site, and procedure verified Lesion destroyed using liquid nitrogen: Yes   Region frozen until ice ball extended beyond lesion: Yes   Outcome: patient tolerated procedure well with no complications   Post-procedure details: wound care instructions given    Epidermal / dermal shaving - Neck - Anterior, Right Axilla  Informed consent: discussed and consent obtained   Patient was prepped and draped in usual sterile fashion: Area prepped with alcohol. Anesthesia: the lesion was anesthetized in a standard fashion   Anesthetic:  1% lidocaine w/ epinephrine 1-100,000 buffered w/ 8.4% NaHCO3 Instrument used: scissors   Hemostasis achieved with: pressure, aluminum chloride and electrodesiccation   Outcome: patient tolerated procedure well   Post-procedure details: wound care instructions given    Return in about 1 year (around 11/18/2020) for TBSE.   I, Ashok Cordia, CMA, am acting as scribe for Sarina Ser, MD .   Documentation:  I have reviewed the above documentation for accuracy and completeness, and I agree with the above.  Sarina Ser, MD

## 2019-11-19 NOTE — Patient Instructions (Signed)

## 2019-11-22 ENCOUNTER — Encounter: Payer: Self-pay | Admitting: Dermatology

## 2019-11-26 ENCOUNTER — Other Ambulatory Visit: Payer: Self-pay | Admitting: Unknown Physician Specialty

## 2019-11-26 NOTE — Telephone Encounter (Signed)
Requested medication (s) are due for refill today: no  Requested medication (s) are on the active medication list: yes  Last refill:  12/11/2017  Future visit scheduled:no  Notes to clinic:  this script expired on 4 /01/2019 Patient is also due for follow up     Requested Prescriptions  Pending Prescriptions Disp Refills   atorvastatin (LIPITOR) 10 MG tablet [Pharmacy Med Name: ATORVASTATIN 10 MG TABLET] 90 tablet 1    Sig: TAKE ONE TABLET BY MOUTH DAILY      Cardiovascular:  Antilipid - Statins Failed - 11/26/2019  7:11 AM      Failed - Total Cholesterol in normal range and within 360 days    Cholesterol, Total  Date Value Ref Range Status  03/19/2017 167 100 - 199 mg/dL Final   Cholesterol Piccolo, Waived  Date Value Ref Range Status  10/26/2015 151 <200 mg/dL Final    Comment:                            Desirable                <200                         Borderline High      200- 239                         High                     >239           Failed - LDL in normal range and within 360 days    LDL Calculated  Date Value Ref Range Status  03/19/2017 107 (H) 0 - 99 mg/dL Final          Failed - HDL in normal range and within 360 days    HDL  Date Value Ref Range Status  03/19/2017 45 >39 mg/dL Final          Failed - Triglycerides in normal range and within 360 days    Triglycerides  Date Value Ref Range Status  03/19/2017 73 0 - 149 mg/dL Final   Triglycerides Piccolo,Waived  Date Value Ref Range Status  10/26/2015 133 <150 mg/dL Final    Comment:                            Normal                   <150                         Borderline High     150 - 199                         High                200 - 499                         Very High                >499           Passed - Patient is not pregnant      Passed -  Valid encounter within last 12 months    Recent Outpatient Visits           4 months ago Upper respiratory infection, viral    [facility] Carnella Guadalajara I, NP   10 months ago Sore throat   [facility] Merrie Roof [place], [place]   1 year ago Acute non-recurrent frontal sinusitis   [facility], Henrine Screws T, NP   1 year ago Acute lower UTI   [facility], Lilia Argue, [place]   1 year ago Essential hypertension   [facility] Volney American, [place]       Future Appointments             In 3 months Ralene Bathe, MD [place]

## 2019-11-26 NOTE — Telephone Encounter (Signed)
LOV: 08/21/2019; NOV: not scheduled.   Last filled 10/29/2017 for 30 day supply with 2 refills by Kathrine Haddock, NP.

## 2019-12-17 ENCOUNTER — Encounter: Payer: Self-pay | Admitting: Nurse Practitioner

## 2020-03-11 ENCOUNTER — Other Ambulatory Visit: Payer: Self-pay

## 2020-03-11 ENCOUNTER — Ambulatory Visit: Payer: BC Managed Care – PPO | Admitting: Dermatology

## 2020-03-11 ENCOUNTER — Encounter: Payer: Self-pay | Admitting: Dermatology

## 2020-03-11 DIAGNOSIS — D229 Melanocytic nevi, unspecified: Secondary | ICD-10-CM | POA: Diagnosis not present

## 2020-03-11 DIAGNOSIS — D224 Melanocytic nevi of scalp and neck: Secondary | ICD-10-CM

## 2020-03-11 DIAGNOSIS — L820 Inflamed seborrheic keratosis: Secondary | ICD-10-CM

## 2020-03-11 DIAGNOSIS — D2239 Melanocytic nevi of other parts of face: Secondary | ICD-10-CM

## 2020-03-11 DIAGNOSIS — D485 Neoplasm of uncertain behavior of skin: Secondary | ICD-10-CM

## 2020-03-11 NOTE — Patient Instructions (Signed)

## 2020-03-11 NOTE — Progress Notes (Signed)
   Follow-Up Visit   Subjective  Ellen May is a 49 y.o. female who presents for the following: lesions (on the right neck and chest area - patient would like removed).  The following portions of the chart were reviewed this encounter and updated as appropriate:  Tobacco  Allergies  Meds  Problems  Med Hx  Surg Hx  Fam Hx     Review of Systems:  No other skin or systemic complaints except as noted in HPI or Assessment and Plan.  Objective  Well appearing patient in no apparent distress; mood and affect are within normal limits.  A focused examination was performed including the face, neck , and chest. Relevant physical exam findings are noted in the Assessment and Plan.  Objective  R neck: Erythematous keratotic or waxy stuck-on papule or plaque.   Objective  R clavicle neck: 0.6 cm brown papule   Objective  R chin: Flesh colored papule   Assessment & Plan  Inflamed seborrheic keratosis R neck  Destruction of lesion - R neck Complexity: simple   Destruction method comment:  Snip excision with electrodesication and curettage Informed consent: discussed and consent obtained   Timeout:  patient name, date of birth, surgical site, and procedure verified Procedure prep:  Patient was prepped and draped in usual sterile fashion Prep type:  Isopropyl alcohol Anesthesia: the lesion was anesthetized in a standard fashion   Anesthetic:  1% lidocaine w/ epinephrine 1-100,000 buffered w/ 8.4% NaHCO3 Hemostasis achieved with:  aluminum chloride and electrodesiccation Outcome: patient tolerated procedure well with no complications   Post-procedure details: sterile dressing applied and wound care instructions given   Dressing type: bacitracin   Additional details:  R neck x 2  Neoplasm of uncertain behavior of skin R clavicle neck  Epidermal / dermal shaving  Lesion diameter (cm):  0.6 Informed consent: discussed and consent obtained   Timeout: patient name, date of  birth, surgical site, and procedure verified   Procedure prep:  Patient was prepped and draped in usual sterile fashion Prep type:  Isopropyl alcohol Anesthesia: the lesion was anesthetized in a standard fashion   Anesthetic:  1% lidocaine w/ epinephrine 1-100,000 buffered w/ 8.4% NaHCO3 Instrument used: flexible razor blade   Hemostasis achieved with: pressure, aluminum chloride and electrodesiccation   Outcome: patient tolerated procedure well   Post-procedure details: sterile dressing applied and wound care instructions given   Dressing type: bandage and petrolatum    Specimen 1 - Surgical pathology Differential Diagnosis: D48.5 irritated nevus r/o dysplasia Check Margins: No 0.6 cm brown papule  Nevus R chin  Benign, observe. Discussed shave removal if bothersome.   Melanocytic Nevi - Tan-brown and/or pink-flesh-colored symmetric macules and papules - Benign appearing on exam today - Observation - Call clinic for new or changing moles - Recommend daily use of broad spectrum spf 30+ sunscreen to sun-exposed areas.   Return for appointment as scheduled for TBSE - hx of dysplastic nevus.  Luther Redo, CMA, am acting as scribe for Sarina Ser, MD .  Documentation: I have reviewed the above documentation for accuracy and completeness, and I agree with the above.  Sarina Ser, MD

## 2020-03-16 ENCOUNTER — Telehealth: Payer: Self-pay

## 2020-03-16 NOTE — Telephone Encounter (Signed)
-----   Message from Ralene Bathe, MD sent at 03/16/2020 10:29 AM EDT ----- Skin , right clavicle neck MELANOCYTIC NEVUS, INTRADERMAL TYPE, BASE INVOLVED  Benign mole

## 2020-03-16 NOTE — Telephone Encounter (Signed)
Discussed biopsy results with pt  °

## 2020-03-18 ENCOUNTER — Encounter: Payer: Self-pay | Admitting: Dermatology

## 2020-05-06 LAB — HM DIABETES EYE EXAM

## 2020-07-29 ENCOUNTER — Ambulatory Visit: Payer: Self-pay

## 2020-07-29 NOTE — Telephone Encounter (Signed)
Patient called and says she has tested positive for COVID via a home test on 07/24/20 and a nasal viral test on Tuesday. She says her initial symptom of a low grade fever of 99.5 started on 07/21/20, then on 07/24/20 she loss taste and smell. She says her temp has gotten as high as 103, but is now hanging around 100. She says she feels better, but is concerned about the fever. Other symptoms are cough with clear/greenish phlegm at times, sinus congestion, fatigue, headache. She denies SOB. I advised she will need a virtual visit with the provider, however no available appointments. I advised the earliest is next Tuesday. Advised someone from the office will call with the appointment after calling the office and no answer due to right after lunch. Care advice given, patient verbalized understanding.   Reason for Disposition . [1] PERSISTING SYMPTOMS OF COVID-19 AND [2] NO medical visit for COVID-19 in past 2 weeks  Answer Assessment - Initial Assessment Questions 1. COVID-19 ONSET: "When did the symptoms of COVID-19 first start?"     07/21/20 (low grade fever 99.5) 2. DIAGNOSIS CONFIRMATION: "How were you diagnosed?" (e.g., COVID-19 oral or nasal viral test; COVID-19 antibody test; doctor visit)     07/24/20 home test; nasal viral test confirmed  3. MAIN SYMPTOM:  "What is your main concern or symptom right now?" (e.g., breathing difficulty, cough, fatigue. loss of smell)     Fever low grade 100 4. SYMPTOM ONSET: "When did the symptoms start?"     07/25/19 (loss of taste and smell) 5. BETTER-SAME-WORSE: "Are you getting better, staying the same, or getting worse over the last 1 to 2 weeks?"     Better 6. RECENT MEDICAL VISIT: "Have you been seen by a healthcare provider (doctor, NP, PA) for these persisting COVID-19 symptoms?" If Yes, ask: "When were you seen?" (e.g., date)     No 7. COUGH: "Do you have a cough?" If Yes, ask: "How bad is the cough?"       Yes, light green to clear, not a bad cough 8.  FEVER: "Do you have a fever?" If Yes, ask: "What is your temperature, how was it measured, and when did it start?"     100 9. BREATHING DIFFICULTY: "Are you having any trouble breathing?" If Yes, ask: "How bad is your breathing?" (e.g., mild, moderate, severe)    - MILD: No SOB at rest, mild SOB with walking, speaks normally in sentences, can lie down, no retractions, pulse < 100.    - MODERATE: SOB at rest, SOB with minimal exertion and prefers to sit, cannot lie down flat, speaks in phrases, mild retractions, audible wheezing, pulse 100-120.    - SEVERE: Very SOB at rest, speaks in single words, struggling to breathe, sitting hunched forward, retractions, pulse > 120       No 10. HIGH RISK DISEASE: "Do you have any chronic medical problems?" (e.g., asthma, heart or lung disease, weak immune system, obesity, etc.)      No 11. VACCINE: "Have you gotten the COVID-19 vaccine?" If Yes ask: "Which one, how many shots, when did you get it?"      No 12. PREGNANCY: "Is there any chance you are pregnant?" "When was your last menstrual period?"       No 13. OTHER SYMPTOMS: "Do you have any other symptoms?"  (e.g., fatigue, headache, muscle pain, weakness)       Fatigue, headache, sinus congestion  Protocols used: CORONAVIRUS (COVID-19) PERSISTING SYMPTOMS FOLLOW-UP CALL-A-AH

## 2020-08-02 NOTE — Telephone Encounter (Signed)
Called pt to see if she still needed an appt, no answer, left vm

## 2020-08-02 NOTE — Telephone Encounter (Signed)
Yes virtual. I can see her today if needed.

## 2020-08-03 NOTE — Telephone Encounter (Signed)
Pt stated she did not need apt . Will call if she needs [place].

## 2020-11-18 ENCOUNTER — Other Ambulatory Visit: Payer: Self-pay

## 2020-11-18 ENCOUNTER — Encounter: Payer: Self-pay | Admitting: Dermatology

## 2020-11-18 ENCOUNTER — Ambulatory Visit: Payer: BC Managed Care – PPO | Admitting: Dermatology

## 2020-11-18 DIAGNOSIS — D485 Neoplasm of uncertain behavior of skin: Secondary | ICD-10-CM

## 2020-11-18 DIAGNOSIS — L814 Other melanin hyperpigmentation: Secondary | ICD-10-CM

## 2020-11-18 DIAGNOSIS — Z1283 Encounter for screening for malignant neoplasm of skin: Secondary | ICD-10-CM

## 2020-11-18 DIAGNOSIS — D1800 Hemangioma unspecified site: Secondary | ICD-10-CM

## 2020-11-18 DIAGNOSIS — D229 Melanocytic nevi, unspecified: Secondary | ICD-10-CM | POA: Diagnosis not present

## 2020-11-18 DIAGNOSIS — Z86018 Personal history of other benign neoplasm: Secondary | ICD-10-CM

## 2020-11-18 DIAGNOSIS — L578 Other skin changes due to chronic exposure to nonionizing radiation: Secondary | ICD-10-CM | POA: Diagnosis not present

## 2020-11-18 DIAGNOSIS — L918 Other hypertrophic disorders of the skin: Secondary | ICD-10-CM

## 2020-11-18 DIAGNOSIS — L821 Other seborrheic keratosis: Secondary | ICD-10-CM

## 2020-11-18 NOTE — Progress Notes (Signed)
Follow-Up Visit   Subjective  Ellen May is a 50 y.o. female who presents for the following: UBSE (Patient has a lesion on her L index finger that is painful and she would like removed). The patient presents for Upper Body Skin Exam (UBSE) for skin cancer screening and mole check.  The following portions of the chart were reviewed this encounter and updated as appropriate:   Tobacco  Allergies  Meds  Problems  Med Hx  Surg Hx  Fam Hx     Review of Systems:  No other skin or systemic complaints except as noted in HPI or Assessment and Plan.  Objective  Well appearing patient in no apparent distress; mood and affect are within normal limits.  All skin waist up examined.  Objective  L index finger: 0.8 cm SQ nodule      Assessment & Plan  Neoplasm of uncertain behavior of skin L index finger  Skin excision  Lesion length (cm):  0.8 Lesion width (cm):  0.8 Margin per side (cm):  0 Total excision diameter (cm):  0.8 Informed consent: discussed and consent obtained   Timeout: patient name, date of birth, surgical site, and procedure verified   Procedure prep:  Patient was prepped and draped in usual sterile fashion Prep type:  Isopropyl alcohol Anesthesia: the lesion was anesthetized in a standard fashion   Local anesthetic: 0.5% Bupivocaine - digital block. Instrument used: #15 blade   Hemostasis achieved with: aluminum chloride and electrodesiccation   Hemostasis achieved with comment:  Electrocautery Outcome: patient tolerated procedure well with no complications   Post-procedure details: sterile dressing applied and wound care instructions given   Dressing type: bandage and pressure dressing   Additional details:  Simple excision performed today   Specimen 1 - Surgical pathology Differential Diagnosis: D48.5 r/o Digital mucous cyst  Check Margins: No SQ nodule 0.8 cm   Skin cancer screening  Lentigines - Scattered tan macules - Due to sun  exposure - Benign-appering, observe - Recommend daily broad spectrum sunscreen SPF 30+ to sun-exposed areas, reapply every 2 hours as needed. - Call for any changes  Seborrheic Keratoses - Stuck-on, waxy, tan-brown papules and/or plaques  - Benign-appearing - Discussed benign etiology and prognosis. - Observe - Call for any changes  Melanocytic Nevi - Tan-brown and/or pink-flesh-colored symmetric macules and papules - Benign appearing on exam today - Observation - Call clinic for new or changing moles - Recommend daily use of broad spectrum spf 30+ sunscreen to sun-exposed areas.   Hemangiomas - Red papules - Discussed benign nature - Observe - Call for any changes  Actinic Damage - Chronic condition, secondary to cumulative UV/sun exposure - diffuse scaly erythematous macules with underlying dyspigmentation - Recommend daily broad spectrum sunscreen SPF 30+ to sun-exposed areas, reapply every 2 hours as needed.  - Staying in the shade or wearing long sleeves, sun glasses (UVA+UVB protection) and wide brim hats (4-inch brim around the entire circumference of the hat) are also recommended for sun protection.  - Call for new or changing lesions.  History of Dysplastic Nevus - R lat hip above waistline  - No evidence of recurrence today - Recommend regular full body skin exams - Recommend daily broad spectrum sunscreen SPF 30+ to sun-exposed areas, reapply every 2 hours as needed.  - Call if any new or changing lesions are noted between office visits  Acrochordons (Skin Tags) - Fleshy, skin-colored pedunculated papules - Benign appearing.  - Observe. - If desired, they  can be removed with an in office procedure that is not covered by insurance. - Please call the clinic if you notice any new or changing lesions. Skin cancer screening performed today.  Return in about 1 year (around 11/18/2021) for TBSE - hx dysplastic nevus.  Luther Redo, CMA, am acting as scribe for  Sarina Ser, MD .  Documentation: I have reviewed the above documentation for accuracy and completeness, and I agree with the above.  Sarina Ser, MD

## 2020-11-18 NOTE — Patient Instructions (Signed)
If you have any questions or concerns for your doctor, please call our main line at [phone] and press option 4 to reach your doctor's medical assistant. If no one answers, please leave a voicemail as directed and we will return your call as soon as possible. Messages left after 4 pm will be answered the following business day.   You may also send us a message via MyChart. We typically respond to MyChart messages within 1-2 business days.  For prescription refills, please ask your pharmacy to contact our office. Our fax number is [phone].  If you have an urgent issue when the clinic is closed that cannot wait until the next business day, you can page your doctor at the number below.    Please note that while we do our best to be available for urgent issues outside of office hours, we are not available 24/7.   If you have an urgent issue and are unable to reach us, you may choose to seek medical care at your doctor's office, retail clinic, urgent care center, or emergency room.  If you have a medical emergency, please immediately call 911 or go to the emergency department.  Pager Numbers  - Dr. Kowalski: [phone]  - Dr. Moye: [phone]  - Dr. Stewart: [phone]  In the event of inclement weather, please call our main line at [phone] for an update on the status of any delays or closures.  Dermatology Medication Tips: Please keep the boxes that topical medications come in in order to help keep track of the instructions about where and how to use these. Pharmacies typically print the medication instructions only on the boxes and not directly on the medication tubes.   If your medication is too expensive, please contact our office at [phone] option 4 or send us a message through MyChart.   We are unable to tell what your co-pay for medications will be in advance as this is different depending on your insurance coverage. However, we may be able to find a  substitute medication at lower cost or fill out paperwork to get insurance to cover a needed medication.   If a prior authorization is required to get your medication covered by your insurance company, please allow us 1-2 business days to complete this process.  Drug prices often vary depending on where the prescription is filled and some pharmacies may offer cheaper prices.  The website www.goodrx.com contains coupons for medications through different pharmacies. The prices here do not account for what the cost may be with help from insurance (it may be cheaper with your insurance), but the website can give you the price if you did not use any insurance.  - You can print the associated coupon and take it with your prescription to the pharmacy.  - You may also stop by our office during regular business hours and pick up a GoodRx coupon card.  - If you need your prescription sent electronically to a different pharmacy, notify our office through [place] MyChart or by phone at [phone] option 4.     Wound Care Instructions  1. Cleanse wound gently with soap and water once a day then pat dry with clean gauze. Apply a thing coat of Petrolatum (petroleum jelly, "Vaseline") over the wound (unless you have an allergy to this). We recommend that you use a new, sterile tube of Vaseline. Do not pick or remove scabs. Do not remove the yellow or white "healing tissue" from the base of the wound.    2. Cover the wound with fresh, clean, nonstick gauze and secure with paper tape. You may use Band-Aids in place of gauze and tape if the would is small enough, but would recommend trimming much of the tape off as there is often too much. Sometimes Band-Aids can irritate the skin.  3. You should call the office for your biopsy report after 1 week if you have not already been contacted.  4. If you experience any problems, such as abnormal amounts of bleeding, swelling, significant bruising, significant pain,  or evidence of infection, please call the office immediately.  5. FOR ADULT SURGERY PATIENTS: If you need something for pain relief you may take 1 extra strength Tylenol (acetaminophen) AND 2 Ibuprofen (200mg each) together every 4 hours as needed for pain. (do not take these if you are allergic to them or if you have a reason you should not take them.) Typically, you may only need pain medication for 1 to 3 days.     

## 2020-11-21 ENCOUNTER — Encounter: Payer: Self-pay | Admitting: Dermatology

## 2020-11-25 ENCOUNTER — Ambulatory Visit: Payer: BC Managed Care – PPO | Admitting: Dermatology

## 2020-12-03 LAB — BASIC METABOLIC PANEL
BUN: 8 (ref 4–21)
CO2: 27 — AB (ref 13–22)
Chloride: 103 (ref 99–108)
Creatinine: 0.5 (ref 0.5–1.1)
Glucose: 277
Potassium: 4.1 (ref 3.4–5.3)
Sodium: 136 — AB (ref 137–147)

## 2020-12-03 LAB — HEMOGLOBIN A1C: Hemoglobin A1C: 11.2

## 2020-12-09 ENCOUNTER — Other Ambulatory Visit: Payer: Self-pay

## 2020-12-09 ENCOUNTER — Encounter: Payer: Self-pay | Admitting: Family Medicine

## 2020-12-09 ENCOUNTER — Ambulatory Visit: Payer: BC Managed Care – PPO | Admitting: Family Medicine

## 2020-12-09 VITALS — BP 161/80 | HR 101 | Temp 97.9°F | Ht 64.0 in | Wt 145.0 lb

## 2020-12-09 DIAGNOSIS — G2581 Restless legs syndrome: Secondary | ICD-10-CM | POA: Diagnosis not present

## 2020-12-09 DIAGNOSIS — E1165 Type 2 diabetes mellitus with hyperglycemia: Secondary | ICD-10-CM

## 2020-12-09 DIAGNOSIS — Z114 Encounter for screening for human immunodeficiency virus [HIV]: Secondary | ICD-10-CM

## 2020-12-09 DIAGNOSIS — I10 Essential (primary) hypertension: Secondary | ICD-10-CM | POA: Diagnosis not present

## 2020-12-09 DIAGNOSIS — F418 Other specified anxiety disorders: Secondary | ICD-10-CM | POA: Diagnosis not present

## 2020-12-09 DIAGNOSIS — Z1159 Encounter for screening for other viral diseases: Secondary | ICD-10-CM

## 2020-12-09 DIAGNOSIS — E7800 Pure hypercholesterolemia, unspecified: Secondary | ICD-10-CM

## 2020-12-09 LAB — URINALYSIS, ROUTINE W REFLEX MICROSCOPIC
Bilirubin, UA: NEGATIVE
Leukocytes,UA: NEGATIVE
Nitrite, UA: POSITIVE — AB
Protein,UA: NEGATIVE
RBC, UA: NEGATIVE
Specific Gravity, UA: 1.01 (ref 1.005–1.030)
Urobilinogen, Ur: 0.2 mg/dL (ref 0.2–1.0)
pH, UA: 5.5 (ref 5.0–7.5)

## 2020-12-09 LAB — MICROALBUMIN, URINE WAIVED
Creatinine, Urine Waived: 50 mg/dL (ref 10–300)
Microalb, Ur Waived: 10 mg/L (ref 0–19)

## 2020-12-09 LAB — MICROSCOPIC EXAMINATION: RBC, Urine: NONE SEEN /hpf (ref 0–2)

## 2020-12-09 LAB — BAYER DCA HB A1C WAIVED: HB A1C (BAYER DCA - WAIVED): 10.2 % — ABNORMAL HIGH (ref <7.0)

## 2020-12-09 MED ORDER — LISINOPRIL 5 MG PO TABS: 5.0000 mg | ORAL_TABLET | Freq: Every day | ORAL | 3 refills | Status: DC

## 2020-12-09 MED ORDER — ROPINIROLE HCL 0.25 MG PO TABS: 0.2500 mg | ORAL_TABLET | Freq: Every day | ORAL | 2 refills | Status: DC

## 2020-12-09 NOTE — Assessment & Plan Note (Signed)
Stable. Continue to monitor. Call with any concerns.  ?

## 2020-12-09 NOTE — Progress Notes (Signed)
BP (!) 161/80   Pulse (!) 101   Temp 97.9 F (36.6 C)   Ht 5\' 4"  (1.626 m)   Wt 145 lb (65.8 kg)   SpO2 98%   BMI 24.89 kg/m    Subjective:    Patient ID: Ellen May, female    DOB: 1970/11/03, 50 y.o.   MRN: [id number]  HPI: Ellen May is a 50 y.o. female  Chief Complaint  Patient presents with  . Diabetes  . Hypertension  . Hyperlipidemia  . restless leg syndrome    Patient states medication she was prescribed made her feel worse. Gave her insomnia, irritability, anxious.    HYPERTENSION / HYPERLIPIDEMIA Satisfied with current treatment? yes Duration of hypertension: chronic BP monitoring frequency: not checking BP medication side effects: no Past BP meds: lisinopril- has not been on it many months Duration of hyperlipidemia: chronic Cholesterol medication side effects: no Cholesterol supplements: none Past cholesterol medications: atorvastatin Medication compliance: good compliance Aspirin: no Recent stressors: no Recurrent headaches: no Visual changes: no Palpitations: no Dyspnea: no Chest pain: no Lower extremity edema: no Dizzy/lightheaded: no  RESTLESS LEGS Duration: 5-10 years Discomfort description:  At night, not feeling anything Pain: no Location: lower legs Bilateral: yes Symmetric: unsure  Severity: severe Onset:  unsure Frequency:  intermittent Symptoms only occur while legs at rest: yes Sudden unintentional leg jerking: yes Bed partner bothered by leg movements: yes LE numbness: no Decreased sensation: no Weakness: no Insomnia: no Daytime somnolence: no Fatigue: yes Status: worse Treatments attempted: cymbalta  DIABETES Hypoglycemic episodes:no Polydipsia/polyuria: yes Visual disturbance: no Chest pain: no Paresthesias: no Glucose Monitoring: no  Accucheck frequency: Not Checking Taking Insulin?: no Blood Pressure Monitoring: not checking Retinal Examination: Up to Date Foot Exam: Up to Date Diabetic  Education: Not Completed Pneumovax: Up to Date Influenza: Not up to Date Aspirin: yes  Relevant past medical, surgical, family and social history reviewed and updated as indicated. Interim medical history since our last visit reviewed. Allergies and medications reviewed and updated.  Review of Systems  Constitutional: Negative.   Respiratory: Negative.   Cardiovascular: Negative.   Gastrointestinal: Negative.   Musculoskeletal: Negative.   Psychiatric/Behavioral: Negative.     Per HPI unless specifically indicated above     Objective:    BP (!) 161/80   Pulse (!) 101   Temp 97.9 F (36.6 C)   Ht 5\' 4"  (1.626 m)   Wt 145 lb (65.8 kg)   SpO2 98%   BMI 24.89 kg/m   Wt Readings from Last 3 Encounters:  12/09/20 145 lb (65.8 kg)  05/29/18 151 lb 8 oz (68.7 kg)  04/08/18 153 lb (69.4 kg)    Physical Exam Vitals and nursing note reviewed.  Constitutional:      General: She is not in acute distress.    Appearance: Normal appearance. She is not ill-appearing, toxic-appearing or diaphoretic.  HENT:     Head: Normocephalic and atraumatic.     Right Ear: External ear normal.     Left Ear: External ear normal.     Nose: Nose normal.     Mouth/Throat:     Mouth: Mucous membranes are moist.     Pharynx: Oropharynx is clear.  Eyes:     General: No scleral icterus.       Right eye: No discharge.        Left eye: No discharge.     Extraocular Movements: Extraocular movements intact.     Conjunctiva/sclera: Conjunctivae normal.  Pupils: Pupils are equal, round, and reactive to light.  Cardiovascular:     Rate and Rhythm: Normal rate and regular rhythm.     Pulses: Normal pulses.     Heart sounds: Normal heart sounds. No murmur heard. No friction rub. No gallop.   Pulmonary:     Effort: Pulmonary effort is normal. No respiratory distress.     Breath sounds: Normal breath sounds. No stridor. No wheezing, rhonchi or rales.  Chest:     Chest wall: No tenderness.   Musculoskeletal:        General: Normal range of motion.     Cervical back: Normal range of motion and neck supple.  Skin:    General: Skin is warm and dry.     Capillary Refill: Capillary refill takes less than 2 seconds.     Coloration: Skin is not jaundiced or pale.     Findings: No bruising, erythema, lesion or rash.  Neurological:     General: No focal deficit present.     Mental Status: She is alert and oriented to person, place, and time. Mental status is at baseline.  Psychiatric:        Mood and Affect: Mood normal.        Behavior: Behavior normal.        Thought Content: Thought content normal.        Judgment: Judgment normal.     Results for orders placed or performed in visit on 12/09/20  Microscopic Examination   Urine  Result Value Ref Range   WBC, UA 0-5 0 - 5 /hpf   RBC None seen 0 - 2 /hpf   Epithelial Cells (non renal) 0-10 0 - 10 /hpf   Bacteria, UA Many (A) None seen/Few  Microalbumin, Urine Waived  Result Value Ref Range   Microalb, Ur Waived 10 0 - 19 mg/L   Creatinine, Urine Waived 50 10 - 300 mg/dL   Microalb/Creat Ratio 30-300 (H) <30 mg/g  Urinalysis, Routine w reflex microscopic  Result Value Ref Range   Specific Gravity, UA 1.010 1.005 - 1.030   pH, UA 5.5 5.0 - 7.5   Color, UA Yellow Yellow   Appearance Ur Cloudy (A) Clear   Leukocytes,UA Negative Negative   Protein,UA Negative Negative/Trace   Glucose, UA 3+ (A) Negative   Ketones, UA Trace (A) Negative   RBC, UA Negative Negative   Bilirubin, UA Negative Negative   Urobilinogen, Ur 0.2 0.2 - 1.0 mg/dL   Nitrite, UA Positive (A) Negative   Microscopic Examination See below:   Hemoglobin A1c  Result Value Ref Range   Hemoglobin A1C 83.4   Basic metabolic panel  Result Value Ref Range   Glucose 277    BUN 8 4 - 21   CO2 27 (A) 13 - 22   Creatinine 0.5 0.5 - 1.1   Potassium 4.1 3.4 - 5.3   Sodium 136 (A) 137 - 147   Chloride 103 99 - 108  Bayer DCA Hb A1c Waived  Result Value  Ref Range   HB A1C (BAYER DCA - WAIVED) 10.2 (H) <7.0 %      Assessment & Plan:   Problem List Items Addressed This Visit      Cardiovascular and Mediastinum   Hypertension    Not uder good control. Has been off her lisinopril for an unclear amount of time. Will get her back on 5mg  daily and recheck 1 month. Continue to monitor.  Relevant Medications   lisinopril (ZESTRIL) 5 MG tablet   atorvastatin (LIPITOR) 20 MG tablet   Other Relevant Orders   CBC with Differential/Platelet   Microalbumin, Urine Waived (Completed)   TSH     Endocrine   Poorly controlled type 2 diabetes mellitus ([place]) - Primary    Not under good control with A1c of 11.2- following with endocrinology. Continue to follow with them. Call with any concerns.       Relevant Medications   lisinopril (ZESTRIL) 5 MG tablet   metFORMIN (GLUCOPHAGE-XR) 500 MG 24 hr tablet   empagliflozin (JARDIANCE) 25 MG TABS tablet   atorvastatin (LIPITOR) 20 MG tablet   Other Relevant Orders   CBC with Differential/Platelet   Microalbumin, Urine Waived (Completed)   Urinalysis, Routine w reflex microscopic (Completed)     Other   Situational anxiety    Stable. Continue to monitor. Call with any concerns.       Relevant Orders   CBC with Differential/Platelet   Hyperlipidemia    Under good control on current regimen. Continue current regimen. Continue to monitor. Call with any concerns. Refills through endocrine. Labs drawn today.       Relevant Medications   lisinopril (ZESTRIL) 5 MG tablet   atorvastatin (LIPITOR) 20 MG tablet   Other Relevant Orders   CBC with Differential/Platelet   Lipid Panel [place], Waived   Restless legs    Did not tolerate cymbalta. Will check labs and start requip. Recheck 1 month.        Other Visit Diagnoses    RLS (restless legs syndrome)       Relevant Orders   Ferritin   Screening for HIV without presence of risk factors       Labs drawn today. Await results.    Relevant  Orders   HIV Antibody (routine testing w rflx)   Need for hepatitis C screening test       Labs drawn today. Await results.    Relevant Orders   Hepatitis C Antibody       Follow up plan: Return in about 4 weeks (around 01/06/2021), or follow up BP and leg movements, for GYN records release.

## 2020-12-09 NOTE — Assessment & Plan Note (Signed)
Did not tolerate cymbalta. Will check labs and start requip. Recheck 1 month.

## 2020-12-09 NOTE — Assessment & Plan Note (Addendum)
Not uder good control. Has been off her lisinopril for an unclear amount of time. Will get her back on 5mg  daily and recheck 1 month. Continue to monitor.

## 2020-12-09 NOTE — Assessment & Plan Note (Signed)
Not under good control with A1c of 11.2- following with endocrinology. Continue to follow with them. Call with any concerns.

## 2020-12-09 NOTE — Assessment & Plan Note (Signed)
Under good control on current regimen. Continue current regimen. Continue to monitor. Call with any concerns. Refills through endocrine. Labs drawn today.

## 2020-12-09 NOTE — Patient Instructions (Signed)
Ropinirole Oral Tablets What is this medicine? ROPINIROLE (roe PIN i role) is used to treat symptoms of Parkinson's disease. It is also used to treat Restless Legs Syndrome. This medicine may be used for other purposes; ask your health care provider or pharmacist if you have questions. COMMON BRAND NAME(S): Requip What should I tell my health care provider before I take this medicine? They need to know if you have any of these conditions:  heart disease  high blood pressure  kidney disease  liver disease  low blood pressure  narcolepsy  sleep apnea  smoke tobacco cigarettes  an unusual or allergic reaction to ropinirole, other medicines, foods, dyes, or preservatives  pregnant or trying to get pregnant  breast-feeding How should I use this medicine? Take this medicine by mouth with water. Take it as directed on the prescription label. You can take it with or without food. If it upsets your stomach, take it with food. Keep taking this medicine unless your health care provider tells you to stop. Stopping it too quickly can cause serious side effects. It can also make your condition worse. Talk to your health care provider about the use of this medicine in children. Special care may be needed. Overdosage: If you think you have taken too much of this medicine contact a poison control center or emergency room at once. NOTE: This medicine is only for you. Do not share this medicine with others. What if I miss a dose? If you miss a dose, take it as soon as you can. If it is almost time for your next dose, take only that dose. Do not take double or extra doses. What may interact with this medicine?  alcohol  antihistamines for allergy, cough and cold  certain medicines for depression, anxiety, or psychotic disturbances  certain medicines for seizures like phenobarbital, primidone  certain medicines for sleep  ciprofloxacin  female hormones, like estrogens and birth control  pills  fluvoxamine  general anesthetics like halothane, isoflurane, methoxyflurane, propofol  medicines for blood pressure  medicines that relax muscles for surgery  metoclopramide  narcotic medicines for pain  rifampin  tobacco smoking This list may not describe all possible interactions. Give your health care provider a list of all the medicines, herbs, non-prescription drugs, or dietary supplements you use. Also tell them if you smoke, drink alcohol, or use illegal drugs. Some items may interact with your medicine. What should I watch for while using this medicine? Visit your health care provider for regular checks on your progress. Tell your health care provider if your symptoms do not start to get better or if they get worse. Do not suddenly stop taking this medicine. You may develop a severe reaction. Your health care provider will tell you how much medicine to take. If your health care provider wants you to stop the medicine, the dose may be slowly lowered over time to avoid any side effects. You may get drowsy or dizzy. Do not drive, use machinery, or do anything that needs mental alertness until you know how this drug affects you. Do not stand or sit up quickly, especially if you are an older patient. This reduces the risk of dizzy or fainting spells. Alcohol may interfere with the effect of this medicine. Avoid alcoholic drinks. When taking this medicine, you may fall asleep without notice. You may be doing activities like driving a car, talking, or eating. You may not feel drowsy before it happens. Contact your health care provider right away  if this happens to you. There have been reports of increased sexual urges or other strong urges such as gambling while taking this medicine. If you experience any of these while taking this medicine, you should report this to your health care provider as soon as possible. Your mouth may get dry. Chewing sugarless gum or sucking hard candy and  drinking plenty of water may help. Contact your health care provider if the problem does not go away or is severe. What side effects may I notice from receiving this medicine? Side effects that you should report to your doctor or health care professional as soon as possible:  allergic reactions (skin rash, itching or hives; swelling of the face, lips, or tongue)  changes in emotions or moods  changes in vision  confusion  depressed mood  increased blood pressure  falling asleep during normal activities like driving  fast, irregular heartbeat  hallucinations  low blood pressure (dizziness; feeling faint or lightheaded, falls; unusually weak or tired)  new or increased gambling urges, sexual urges, uncontrolled spending, binge or compulsive eating, or other urges  uncontrollable head, mouth, neck, arm, or leg movements Side effects that usually do not require medical attention (report to your doctor or health care professional if they continue or are bothersome):  constipation  dizziness  drowsiness  dry mouth  headache  nausea This list may not describe all possible side effects. Call your doctor for medical advice about side effects. You may report side effects to FDA at 1-800-FDA-1088. Where should I keep my medicine? Keep out of the reach of children and pets. Store at room temperature between 20 and 25 degrees C (68 and 77 degrees F). Protect from light and moisture. Keep the container tightly closed. Get rid of any unused medicine after the expiration date. To get rid of medicines that are no longer needed or have expired:  Take the medicine to a medicine take-back program. Check with your pharmacy or law enforcement to find a location.  If you cannot return the medicine, check the label or package insert to see if the medicine should be thrown out in the garbage or flushed down the toilet. If you are not sure, ask your health care provider. If it is safe to put it  in the trash, take the medicine out of the container. Mix the medicine with cat litter, dirt, coffee grounds, or other unwanted substance. Seal the mixture in a bag or container. Put it in the trash. NOTE: This sheet is a summary. It may not cover all possible information. If you have questions about this medicine, talk to your doctor, pharmacist, or health care provider.  2021 Elsevier/Gold Standard (2020-03-02 20:54:24)  Restless Legs Syndrome Restless legs syndrome is a condition that causes uncomfortable feelings or sensations in the legs, especially while sitting or lying down. The sensations usually cause an overwhelming urge to move the legs. The arms can also sometimes be affected. The condition can range from mild to severe. The symptoms often interfere with a person's ability to sleep. What are the causes? The cause of this condition is not known. What increases the risk? The following factors may make you more likely to develop this condition:  Being older than 50.  Pregnancy.  Being a woman. In general, the condition is more common in women than in men.  A family history of the condition.  Having iron deficiency.  Overuse of caffeine, nicotine, or alcohol.  Certain medical conditions, such as kidney disease,  Parkinson's disease, or nerve damage.  Certain medicines, such as those for high blood pressure, nausea, colds, allergies, depression, and some heart conditions. What are the signs or symptoms? The main symptom of this condition is uncomfortable sensations in the legs, such as:  Pulling.  Tingling.  Prickling.  Throbbing.  Crawling.  Burning. Usually, the sensations:  Affect both sides of the body.  Are worse when you sit or lie down.  Are worse at night. These may wake you up or make it difficult to fall asleep.  Make you have a strong urge to move your legs.  Are temporarily relieved by moving your legs. The arms can also be affected, but this is  rare. People who have this condition often have tiredness during the day because of their lack of sleep at night. How is this diagnosed? This condition may be diagnosed based on:  Your symptoms.  Blood tests. In some cases, you may be monitored in a sleep lab by a specialist (a sleep study). This can detect any disruptions in your sleep. How is this treated? This condition is treated by managing the symptoms. This may include:  Lifestyle changes, such as exercising, using relaxation techniques, and avoiding caffeine, alcohol, or tobacco.  Medicines. Anti-seizure medicines may be tried first. Follow these instructions at home: General instructions  Take over-the-counter and prescription medicines only as told by your health care provider.  Use methods to help relieve the uncomfortable sensations, such as: ? Massaging your legs. ? Walking or stretching. ? Taking a cold or hot bath.  Keep all follow-up visits as told by your health care provider. This is important. Lifestyle  Practice good sleep habits. For example, go to bed and get up at the same time every day. Most adults should get 7-9 hours of sleep each night.  Exercise regularly. Try to get at least 30 minutes of exercise most days of the week.  Practice ways of relaxing, such as yoga or meditation.  Avoid caffeine and alcohol.  Do not use any products that contain nicotine or tobacco, such as cigarettes and e-cigarettes. If you need help quitting, ask your health care provider.      Contact a health care provider if:  Your symptoms get worse or they do not improve with treatment. Summary  Restless legs syndrome is a condition that causes uncomfortable feelings or sensations in the legs, especially while sitting or lying down.  The symptoms often interfere with a person's ability to sleep.  This condition is treated by managing the symptoms. You may need to make lifestyle changes or take medicines. This  information is not intended to replace advice given to you by your health care provider. Make sure you discuss any questions you have with your health care provider. Document Revised: 08/29/2019 Document Reviewed: 07/30/2017 Elsevier Patient Education  2021 [employer].

## 2020-12-10 ENCOUNTER — Other Ambulatory Visit: Payer: Self-pay | Admitting: Family Medicine

## 2020-12-10 DIAGNOSIS — D649 Anemia, unspecified: Secondary | ICD-10-CM

## 2020-12-10 LAB — CBC WITH DIFFERENTIAL/PLATELET
Basophils Absolute: 0.1 10*3/uL (ref 0.0–0.2)
Basos: 1 %
EOS (ABSOLUTE): 0.1 10*3/uL (ref 0.0–0.4)
Eos: 2 %
Hematocrit: 36.3 % (ref 34.0–46.6)
Hemoglobin: 10 g/dL — ABNORMAL LOW (ref 11.1–15.9)
Immature Grans (Abs): 0 10*3/uL (ref 0.0–0.1)
Immature Granulocytes: 1 %
Lymphocytes Absolute: 2 10*3/uL (ref 0.7–3.1)
Lymphs: 33 %
MCH: 17.8 pg — ABNORMAL LOW (ref 26.6–33.0)
MCHC: 27.5 g/dL — ABNORMAL LOW (ref 31.5–35.7)
MCV: 65 fL — ABNORMAL LOW (ref 79–97)
Monocytes Absolute: 0.4 10*3/uL (ref 0.1–0.9)
Monocytes: 7 %
Neutrophils Absolute: 3.4 10*3/uL (ref 1.4–7.0)
Neutrophils: 56 %
Platelets: 342 10*3/uL (ref 150–450)
RBC: 5.63 x10E6/uL — ABNORMAL HIGH (ref 3.77–5.28)
RDW: 17 % — ABNORMAL HIGH (ref 11.7–15.4)
WBC: 6 10*3/uL (ref 3.4–10.8)

## 2020-12-10 LAB — TSH: TSH: 2.36 u[IU]/mL (ref 0.450–4.500)

## 2020-12-10 LAB — FERRITIN: Ferritin: 4 ng/mL — ABNORMAL LOW (ref 15–150)

## 2020-12-10 LAB — HIV ANTIBODY (ROUTINE TESTING W REFLEX): HIV Screen 4th Generation wRfx: NONREACTIVE

## 2020-12-10 LAB — HEPATITIS C ANTIBODY: Hep C Virus Ab: <0.1 s/co ratio (ref 0.0–0.9)

## 2020-12-10 MED ORDER — IRON (FERROUS SULFATE) 325 (65 FE) MG PO TABS: 325.0000 mg | ORAL_TABLET | Freq: Two times a day (BID) | ORAL | 3 refills | Status: DC

## 2020-12-11 LAB — LIPID PANEL W/O CHOL/HDL RATIO
Cholesterol, Total: 146 mg/dL (ref 100–199)
HDL: 42 mg/dL (ref >39)
LDL Chol Calc (NIH): 81 mg/dL (ref 0–99)
Triglycerides: 127 mg/dL (ref 0–149)
VLDL Cholesterol Cal: 23 mg/dL (ref 5–40)

## 2020-12-11 LAB — SPECIMEN STATUS REPORT

## 2020-12-13 ENCOUNTER — Other Ambulatory Visit: Payer: Self-pay

## 2020-12-13 ENCOUNTER — Other Ambulatory Visit: Payer: BC Managed Care – PPO

## 2020-12-13 DIAGNOSIS — D649 Anemia, unspecified: Secondary | ICD-10-CM

## 2020-12-14 ENCOUNTER — Other Ambulatory Visit: Payer: Self-pay | Admitting: Family Medicine

## 2020-12-14 DIAGNOSIS — D649 Anemia, unspecified: Secondary | ICD-10-CM

## 2020-12-14 LAB — CBC WITH DIFFERENTIAL/PLATELET
Basophils Absolute: 0.1 10*3/uL (ref 0.0–0.2)
Basos: 1 %
EOS (ABSOLUTE): 0.1 10*3/uL (ref 0.0–0.4)
Eos: 1 %
Hematocrit: 33.4 % — ABNORMAL LOW (ref 34.0–46.6)
Hemoglobin: 8.9 g/dL — ABNORMAL LOW (ref 11.1–15.9)
Immature Grans (Abs): 0 10*3/uL (ref 0.0–0.1)
Immature Granulocytes: 1 %
Lymphocytes Absolute: 2.3 10*3/uL (ref 0.7–3.1)
Lymphs: 33 %
MCH: 17.4 pg — ABNORMAL LOW (ref 26.6–33.0)
MCHC: 26.6 g/dL — ABNORMAL LOW (ref 31.5–35.7)
MCV: 65 fL — ABNORMAL LOW (ref 79–97)
Monocytes Absolute: 0.6 10*3/uL (ref 0.1–0.9)
Monocytes: 8 %
Neutrophils Absolute: 4 10*3/uL (ref 1.4–7.0)
Neutrophils: 56 %
Platelets: 303 10*3/uL (ref 150–450)
RBC: 5.11 x10E6/uL (ref 3.77–5.28)
RDW: 16.7 % — ABNORMAL HIGH (ref 11.7–15.4)
WBC: 7.1 10*3/uL (ref 3.4–10.8)

## 2020-12-14 LAB — IRON AND TIBC
Iron Saturation: 17 % (ref 15–55)
Iron: 68 ug/dL (ref 27–159)
Total Iron Binding Capacity: 392 ug/dL (ref 250–450)
UIBC: 324 ug/dL (ref 131–425)

## 2020-12-15 ENCOUNTER — Other Ambulatory Visit: Payer: BC Managed Care – PPO

## 2020-12-15 ENCOUNTER — Other Ambulatory Visit: Payer: Self-pay

## 2020-12-17 ENCOUNTER — Inpatient Hospital Stay: Payer: BC Managed Care – PPO

## 2020-12-17 ENCOUNTER — Inpatient Hospital Stay: Payer: BC Managed Care – PPO | Attending: Oncology | Admitting: Oncology

## 2020-12-17 ENCOUNTER — Encounter: Payer: Self-pay | Admitting: Family Medicine

## 2020-12-17 ENCOUNTER — Encounter: Payer: Self-pay | Admitting: Oncology

## 2020-12-17 VITALS — BP 156/97 | HR 95 | Temp 99.5°F | Resp 18 | Wt 145.1 lb

## 2020-12-17 DIAGNOSIS — D649 Anemia, unspecified: Secondary | ICD-10-CM

## 2020-12-17 DIAGNOSIS — D509 Iron deficiency anemia, unspecified: Secondary | ICD-10-CM | POA: Insufficient documentation

## 2020-12-17 DIAGNOSIS — Z808 Family history of malignant neoplasm of other organs or systems: Secondary | ICD-10-CM

## 2020-12-17 DIAGNOSIS — Z809 Family history of malignant neoplasm, unspecified: Secondary | ICD-10-CM | POA: Insufficient documentation

## 2020-12-17 DIAGNOSIS — Z1211 Encounter for screening for malignant neoplasm of colon: Secondary | ICD-10-CM

## 2020-12-17 LAB — HEPATIC FUNCTION PANEL
ALT: 27 U/L (ref 0–44)
AST: 21 U/L (ref 15–41)
Albumin: 4 g/dL (ref 3.5–5.0)
Alkaline Phosphatase: 70 U/L (ref 38–126)
Bilirubin, Direct: 0.1 mg/dL (ref 0.0–0.2)
Indirect Bilirubin: 0.5 mg/dL (ref 0.3–0.9)
Total Bilirubin: 0.6 mg/dL (ref 0.3–1.2)
Total Protein: 7.1 g/dL (ref 6.5–8.1)

## 2020-12-17 LAB — CBC WITH DIFFERENTIAL/PLATELET
Abs Immature Granulocytes: 0.04 10*3/uL (ref 0.00–0.07)
Basophils Absolute: 0.1 10*3/uL (ref 0.0–0.1)
Basophils Relative: 1 %
Eosinophils Absolute: 0.1 10*3/uL (ref 0.0–0.5)
Eosinophils Relative: 1 %
HCT: 36.6 % (ref 36.0–46.0)
Hemoglobin: 10 g/dL — ABNORMAL LOW (ref 12.0–15.0)
Immature Granulocytes: 1 %
Lymphocytes Relative: 33 %
Lymphs Abs: 2.5 10*3/uL (ref 0.7–4.0)
MCH: 18 pg — ABNORMAL LOW (ref 26.0–34.0)
MCHC: 27.3 g/dL — ABNORMAL LOW (ref 30.0–36.0)
MCV: 65.7 fL — ABNORMAL LOW (ref 80.0–100.0)
Monocytes Absolute: 0.5 10*3/uL (ref 0.1–1.0)
Monocytes Relative: 6 %
Neutro Abs: 4.5 10*3/uL (ref 1.7–7.7)
Neutrophils Relative %: 58 %
Platelets: 332 10*3/uL (ref 150–400)
RBC: 5.57 MIL/uL — ABNORMAL HIGH (ref 3.87–5.11)
RDW: 20.1 % — ABNORMAL HIGH (ref 11.5–15.5)
WBC: 7.4 10*3/uL (ref 4.0–10.5)
nRBC: 0 % (ref 0.0–0.2)

## 2020-12-17 LAB — FECAL OCCULT BLOOD, IMMUNOCHEMICAL: Fecal Occult Bld: NEGATIVE

## 2020-12-17 LAB — RETIC PANEL
Immature Retic Fract: 25.2 % — ABNORMAL HIGH (ref 2.3–15.9)
RBC.: 5.66 MIL/uL — ABNORMAL HIGH (ref 3.87–5.11)
Retic Count, Absolute: 145.5 10*3/uL (ref 19.0–186.0)
Retic Ct Pct: 2.6 % (ref 0.4–3.1)
Reticulocyte Hemoglobin: 24.4 pg — ABNORMAL LOW (ref >27.9)

## 2020-12-17 LAB — LACTATE DEHYDROGENASE: LDH: 117 U/L (ref 98–192)

## 2020-12-17 LAB — TECHNOLOGIST SMEAR REVIEW
RBC MORPHOLOGY: NORMAL
WBC MORPHOLOGY: NORMAL

## 2020-12-17 NOTE — Progress Notes (Signed)
Patient here to establish care for anemia 

## 2020-12-17 NOTE — Progress Notes (Signed)
Hematology/Oncology Consult note [facility] Telephone:([phone] [phone]   Patient Care Team: Valerie Roys, DO as PCP - General (Family Medicine)  REFERRING PROVIDER: Valerie Roys, DO  CHIEF COMPLAINTS/REASON FOR VISIT:  Evaluation of anemia  HISTORY OF PRESENTING ILLNESS:  Ellen May is a  50 y.o.  female with PMH listed below who was referred to me for evaluation of anemia Reviewed patient's recent labs that was done.  12/09/2020 CBC showed hemoglobin 10, MCV 65, normal white count and platelet counts. 12/13/2020 repeat CBC showed hemoglobin of 8.9, MCV 65. Iron panel showed TIBC 392, ferritin 4, iron saturation 17.  Reviewed patient's previous labs ordered by primary care physician's office, anemia is acute onset. She has normal hemoglobin in 2018.  No recent baseline to compare with  No aggravating or improving factors.  Associated signs and symptoms: Patient reports fatigue.  Denies SOB with exertion.  Denies weight loss, easy bruising, hematochezia, hemoptysis, hematuria Her menstrual period is regular.  Sometimes heavy. No unintentional weight loss, night sweats, fever  Patient was accompanied by husband.  Patient has started oral iron supplementation twice daily.  She tolerates well.  Review of Systems  Constitutional: Positive for fatigue. Negative for appetite change, chills and fever.  HENT:   Negative for hearing loss and voice change.   Eyes: Negative for eye problems.  Respiratory: Negative for chest tightness and cough.   Cardiovascular: Negative for chest pain.  Gastrointestinal: Negative for abdominal distention, abdominal pain and blood in stool.  Endocrine: Negative for hot flashes.  Genitourinary: Positive for menstrual problem. Negative for difficulty urinating and frequency.   Musculoskeletal: Negative for arthralgias.  Skin: Negative for itching and rash.  Neurological: Negative for extremity  weakness.  Hematological: Negative for adenopathy.  Psychiatric/Behavioral: Negative for confusion.     MEDICAL HISTORY:  Past Medical History:  Diagnosis Date  . Diabetes mellitus without complication ([place])   . Dysplastic nevus 09/18/2019   right lat hip above waistline  . Hyperlipidemia   . Hypertension   . Situational anxiety     SURGICAL HISTORY: History reviewed. No pertinent surgical history.  SOCIAL HISTORY: Social History   Socioeconomic History  . Marital status: Married    Spouse name: Not on file  . Number of children: Not on file  . Years of education: Not on file  . Highest education level: Not on file  Occupational History  . Not on file  Tobacco Use  . Smoking status: Never Smoker  . Smokeless tobacco: Never Used  Vaping Use  . Vaping Use: Never used  Substance and Sexual Activity  . Alcohol use: No  . Drug use: No  . Sexual activity: Yes  Other Topics Concern  . Not on file  Social History Narrative  . Not on file   Social Determinants of Health   Financial Resource Strain: Not on file  Food Insecurity: Not on file  Transportation Needs: Not on file  Physical Activity: Not on file  Stress: Not on file  Social Connections: Not on file  Intimate Partner Violence: Not on file    FAMILY HISTORY: Family History  Problem Relation Age of Onset  . Diabetes Mother   . Hypertension Mother   . Skin cancer Mother   . Heart disease Father   . Alcohol abuse Father   . Hypertension Father   . Diabetes Sister   . Arthritis Sister        RA    ALLERGIES:  is allergic to actos [pioglitazone].  MEDICATIONS:  Current Outpatient Medications  Medication Sig Dispense Refill  . atorvastatin (LIPITOR) 20 MG tablet Take 1 tablet (20 mg total) by mouth daily. 90 tablet 3  . Continuous Blood Gluc Sensor (FREESTYLE LIBRE SENSOR SYSTEM) MISC Use 1 each as directed. Please place prior to office visit as directed    . empagliflozin (JARDIANCE) 25 MG TABS  tablet Take 1 tablet (25 mg total) by mouth daily. 30 tablet 2  . fluconazole (DIFLUCAN) 150 MG tablet     . ibuprofen (ADVIL) 400 MG tablet Take 400 mg by mouth 3 (three) times daily.    . Iron, Ferrous Sulfate, 325 (65 Fe) MG TABS Take 325 mg by mouth in the morning and at bedtime. 60 tablet 3  . Lancets (ONETOUCH DELICA PLUS ERXVQM08Q) MISC     . lidocaine (XYLOCAINE) 5 % ointment as needed.    Marland Kitchen lisinopril (ZESTRIL) 5 MG tablet Take 1 tablet (5 mg total) by mouth daily. 30 tablet 3  . metFORMIN (GLUCOPHAGE-XR) 500 MG 24 hr tablet Take 2 tablets (1,000 mg total) by mouth daily. 120 tablet 2  . nystatin-triamcinolone ointment (MYCOLOG) as needed.    . ONE TOUCH ULTRA TEST test strip     . Semaglutide 0.25 or 0.5 MG/DOSE SOPN Inject 0.5 mg into the skin once a week.    . insulin degludec (TRESIBA FLEXTOUCH) 200 UNIT/ML FlexTouch Pen Inject 20 Units into the skin daily. (Patient not taking: Reported on 12/17/2020)     No current facility-administered medications for this visit.     PHYSICAL EXAMINATION: ECOG PERFORMANCE STATUS: 1 - Symptomatic but completely ambulatory Vitals:   12/17/20 1108  BP: (!) 156/97  Pulse: 95  Resp: 18  Temp: 99.5 F (37.5 C)   Filed Weights   12/17/20 1108  Weight: 145 lb 1.6 oz (65.8 kg)    Physical Exam Constitutional:      General: She is not in acute distress. HENT:     Head: Normocephalic and atraumatic.  Eyes:     General: No scleral icterus. Cardiovascular:     Rate and Rhythm: Normal rate and regular rhythm.     Heart sounds: Normal heart sounds.  Pulmonary:     Effort: Pulmonary effort is normal. No respiratory distress.     Breath sounds: No wheezing.  Abdominal:     General: Bowel sounds are normal. There is no distension.     Palpations: Abdomen is soft.  Musculoskeletal:        General: No deformity. Normal range of motion.     Cervical back: Normal range of motion and neck supple.  Skin:    General: Skin is warm and dry.      Findings: No erythema or rash.  Neurological:     Mental Status: She is alert and oriented to person, place, and time. Mental status is at baseline.     Cranial Nerves: No cranial nerve deficit.     Coordination: Coordination normal.  Psychiatric:        Mood and Affect: Mood normal.      LABORATORY DATA:  I have reviewed the data as listed Lab Results  Component Value Date   WBC 7.4 12/17/2020   HGB 10.0 (L) 12/17/2020   HCT 36.6 12/17/2020   MCV 65.7 (L) 12/17/2020   PLT 332 12/17/2020   Recent Labs    12/03/20 0000 12/17/20 1149  NA 136*  --   K 4.1  --  CL 103  --   CO2 27*  --   BUN 8  --   CREATININE 0.5  --   PROT  --  7.1  ALBUMIN  --  4.0  AST  --  21  ALT  --  27  ALKPHOS  --  70  BILITOT  --  0.6  BILIDIR  --  0.1  IBILI  --  0.5   Iron/TIBC/Ferritin/ %Sat    Component Value Date/Time   IRON 68 12/13/2020 0807   TIBC 392 12/13/2020 0807   FERRITIN 4 (L) 12/09/2020 0909   IRONPCTSAT 17 12/13/2020 0807        ASSESSMENT & PLAN:  1. Iron deficiency anemia, unspecified iron deficiency anemia type    Labs reviewed and discussed with patient.  Consistent with iron deficiency anemia. Acute drop of hemoglobin from her baseline. From 12/09/2020 to 12/13/2020, without onset of menstrual cycle, patient dropped 1 g of hemoglobin.  Suspect etiology of iron deficiency is through possible GI blood loss. Never had colonoscopy before. Recommend patient to discuss with primary care provider and to be referred to gastroenterology. I discussed about option of oral iron supplementation versus IV Venofer treatments.  Repeat CBC, check LDH, LFT, smear, reticulocyte panel. Labs are reviewed. CBC has showed improvement with hemoglobin to 10, likely due to oral iron supplementation. I recommend patient to continue ferrous sulfate 325 mg twice daily.  Recommend patient to take over-the-counter vitamin C to help with absorption.  Recommend patient to follow-up in 8  weeks with repeat labs and assessment of need of IV Venofer treatments.   Orders Placed This Encounter  Procedures  . CBC with Differential/Platelet    Standing Status:   Future    Number of Occurrences:   1    Standing Expiration Date:   12/17/2021  . Technologist smear review    Standing Status:   Future    Number of Occurrences:   1    Standing Expiration Date:   12/17/2021  . Lactate dehydrogenase    Standing Status:   Future    Number of Occurrences:   1    Standing Expiration Date:   12/17/2021  . Retic Panel    Standing Status:   Future    Number of Occurrences:   1    Standing Expiration Date:   12/17/2021  . Hepatic function panel    Standing Status:   Future    Number of Occurrences:   1    Standing Expiration Date:   12/17/2021    All questions were answered. The patient knows to call the clinic with any problems questions or concerns. Theotis Barrio, DO  Return of visit: 8 weeks  Earlie Server, MD, PhD 12/17/2020

## 2020-12-21 ENCOUNTER — Encounter: Payer: Self-pay | Admitting: Gastroenterology

## 2020-12-21 ENCOUNTER — Other Ambulatory Visit: Payer: Self-pay

## 2020-12-21 ENCOUNTER — Telehealth: Payer: Self-pay

## 2020-12-21 ENCOUNTER — Ambulatory Visit: Payer: BC Managed Care – PPO | Admitting: Gastroenterology

## 2020-12-21 VITALS — BP 173/84 | HR 128 | Ht 64.0 in | Wt 146.6 lb

## 2020-12-21 DIAGNOSIS — D509 Iron deficiency anemia, unspecified: Secondary | ICD-10-CM

## 2020-12-21 DIAGNOSIS — D500 Iron deficiency anemia secondary to blood loss (chronic): Secondary | ICD-10-CM

## 2020-12-21 DIAGNOSIS — R1084 Generalized abdominal pain: Secondary | ICD-10-CM

## 2020-12-21 MED ORDER — CLENPIQ 10-3.5-12 MG-GM -GM/160ML PO SOLN: 320.0000 mL | ORAL | 0 refills | Status: DC

## 2020-12-21 NOTE — Telephone Encounter (Signed)
MyChart message sent to patient notifying her of results.    Please schedule patient in 8 weeks for lab with virtual visit 2 days later.

## 2020-12-21 NOTE — Telephone Encounter (Signed)
-----   Message from Earlie Server, MD sent at 12/17/2020  9:17 PM EDT ----- Please let patient know that her blood level has improved since the start of oral iron supplementation.  Other testing unremarkable Recommend patient to continue ferrous sulfate 325 mg twice daily.  Recommend over-the-counter vitamin C supplementation. Please arrange patient to have iron labs in 8 weeks and virtual visit.  Please order labs.

## 2020-12-21 NOTE — H&P (View-Only) (Signed)
Gastroenterology Consultation  Referring Provider:     Valerie Roys, DO Primary Care Physician:  Valerie Roys, DO Primary Gastroenterologist:  Dr. Allen Norris     Reason for Consultation:     Anemia        HPI:   Ellen May is a 50 y.o. y/o female referred for consultation & management of anemia by Dr. Wynetta Emery, Megan P, DO.  This patient comes in today with a finding of anemia.  The patient was seen by hematology who had noted the patient to have iron deficiency anemia recommended a GI work-up.  The patient's labs recently have shown:  Component     Latest Ref Rng & Units 08/14/2016 12/09/2020 12/09/2020 12/13/2020          8:19 AM  9:09 AM   Hemoglobin     12.0 - 15.0 g/dL 11.7  10.0 (L) 8.9 (L)  HCT     36.0 - 46.0 % 39.3  36.3 33.4 (L)   Component     Latest Ref Rng & Unit 12/17/2020          Hemoglobin     12.0 - 15.0 g/dL 10.0 (L)  HCT     36.0 - 46.0 % 36.6   With the patient's MCV running between 65 and 69.  It was noted that the patient had not ever had a colonoscopy in the past.  The patient states that the diagnosis was made after she had reported to her primary care provider that she was having a lot of symptoms of restless leg syndrome.  She denies being told that she had anemia in the past.  There is no report of any black stools or bloody stools.  She also had a Hemoccult test done that was Hemoccult negative and she denies ever having a colonoscopy or EGD in the past.  Past Medical History:  Diagnosis Date  . Diabetes mellitus without complication ([place])   . Dysplastic nevus 09/18/2019   right lat hip above waistline  . Hyperlipidemia   . Hypertension   . Situational anxiety     No past surgical history on file.  Prior to Admission medications   Medication Sig Start Date End Date Taking? Authorizing Provider  atorvastatin (LIPITOR) 20 MG tablet Take 1 tablet (20 mg total) by mouth daily. 12/09/20   Johnson, Megan P, DO  Continuous Blood Gluc Sensor  (FREESTYLE LIBRE SENSOR SYSTEM) MISC Use 1 each as directed. Please place prior to office visit as directed 09/29/16   [provider]  empagliflozin (JARDIANCE) 25 MG TABS tablet Take 1 tablet (25 mg total) by mouth daily. 12/09/20   Park Liter P, DO  fluconazole (DIFLUCAN) 150 MG tablet  11/21/18   [provider]  ibuprofen (ADVIL) 400 MG tablet Take 400 mg by mouth 3 (three) times daily. 09/08/19   [provider]  insulin degludec (TRESIBA FLEXTOUCH) 200 UNIT/ML FlexTouch Pen Inject 20 Units into the skin daily. Patient not taking: Reported on 12/17/2020 12/10/20   [provider]  Iron, Ferrous Sulfate, 325 (65 Fe) MG TABS Take 325 mg by mouth in the morning and at bedtime. 12/10/20   Park Liter P, DO  Lancets (ONETOUCH DELICA PLUS [id number]) [place]  10/08/19   [provider]  lidocaine (XYLOCAINE) 5 % ointment as needed. 08/25/16   [provider]  lisinopril (ZESTRIL) 5 MG tablet Take 1 tablet (5 mg total) by mouth daily. 12/09/20   Valerie Roys, DO  metFORMIN (GLUCOPHAGE-XR) 500 MG 24 hr tablet Take 2 tablets (1,000 mg total) by mouth daily. 12/09/20   Park Liter P, DO  nystatin-triamcinolone ointment [facility]) as needed. 08/25/16   [provider]  ONE TOUCH ULTRA TEST test strip  02/13/18   [provider]  Semaglutide 0.25 or 0.5 MG/DOSE SOPN Inject 0.5 mg into the skin once a week. 09/28/16   [provider]    Family History  Problem Relation Age of Onset  . Diabetes Mother   . Hypertension Mother   . Skin cancer Mother   . Heart disease Father   . Alcohol abuse Father   . Hypertension Father   . Diabetes Sister   . Arthritis Sister        RA     Social History   Tobacco Use  . Smoking status: Never Smoker  . Smokeless tobacco: Never Used  Vaping Use  . Vaping Use: Never used  Substance Use Topics  . Alcohol use: No  . Drug use: No    Allergies as of 12/21/2020 - Review Complete  12/17/2020  Allergen Reaction Noted  . Actos [pioglitazone] Other (See Comments) 10/22/2015    Review of Systems:    All systems reviewed and negative except where noted in HPI.   Physical Exam:  There were no vitals taken for this visit. No LMP recorded. General:   Alert,  Well-developed, well-nourished, pleasant and cooperative in NAD Head:  Normocephalic and atraumatic. Eyes:  Sclera clear, no icterus.   Conjunctiva pink. Ears:  Normal auditory acuity. Neck:  Supple; no masses or thyromegaly. Lungs:  Respirations even and unlabored.  Clear throughout to auscultation.   No wheezes, crackles, or rhonchi. No acute distress. Heart:  Regular rate and rhythm; no murmurs, clicks, rubs, or gallops. Abdomen:  Normal bowel sounds.  No bruits.  Soft, non-tender and non-distended without masses, hepatosplenomegaly or hernias noted.  No guarding or rebound tenderness.  Negative Carnett sign.   Rectal:  Deferred.  Pulses:  Normal pulses noted. Extremities:  No clubbing or edema.  No cyanosis. Neurologic:  Alert and oriented x3;  grossly normal neurologically. Skin:  Intact without significant lesions or rashes.  No jaundice. Lymph Nodes:  No significant cervical adenopathy. Psych:  Alert and cooperative. Normal mood and affect.  Imaging Studies: No results found.  Assessment and Plan:   [name] is a 50 y.o. y/o female who comes in today with iron deficiency anemia with a low MCV and low iron with low ferritin.  The patient will be set up for an EGD and colonoscopy.  The patient has been told that if these are negative she may need to undergo a capsule endoscopy.  The patient will also have her blood sent off for celiac sprue to look for a possible cause of her not absorbing iron.  The patient has been explained the plan agrees with it    Lucilla Lame, MD. Marval Regal    Note: This dictation was prepared with Dragon dictation along with smaller phrase technology. Any transcriptional  errors that result from this process are unintentional.

## 2020-12-21 NOTE — Progress Notes (Signed)
Gastroenterology Consultation  Referring Provider:     Valerie Roys, DO Primary Care Physician:  Valerie Roys, DO Primary Gastroenterologist:  Dr. Allen Norris     Reason for Consultation:     Anemia        HPI:   Ellen May is a 50 y.o. y/o female referred for consultation & management of anemia by Dr. Wynetta Emery, Megan P, DO.  This patient comes in today with a finding of anemia.  The patient was seen by hematology who had noted the patient to have iron deficiency anemia recommended a GI work-up.  The patient's labs recently have shown:  Component     Latest Ref Rng & Units 08/14/2016 12/09/2020 12/09/2020 12/13/2020          8:19 AM  9:09 AM   Hemoglobin     12.0 - 15.0 g/dL 11.7  10.0 (L) 8.9 (L)  HCT     36.0 - 46.0 % 39.3  36.3 33.4 (L)   Component     Latest Ref Rng & Unit 12/17/2020          Hemoglobin     12.0 - 15.0 g/dL 10.0 (L)  HCT     36.0 - 46.0 % 36.6   With the patient's MCV running between 65 and 69.  It was noted that the patient had not ever had a colonoscopy in the past.  The patient states that the diagnosis was made after she had reported to her primary care provider that she was having a lot of symptoms of restless leg syndrome.  She denies being told that she had anemia in the past.  There is no report of any black stools or bloody stools.  She also had a Hemoccult test done that was Hemoccult negative and she denies ever having a colonoscopy or EGD in the past.  Past Medical History:  Diagnosis Date  . Diabetes mellitus without complication ([place])   . Dysplastic nevus 09/18/2019   right lat hip above waistline  . Hyperlipidemia   . Hypertension   . Situational anxiety     No past surgical history on file.  Prior to Admission medications   Medication Sig Start Date End Date Taking? Authorizing Provider  atorvastatin (LIPITOR) 20 MG tablet Take 1 tablet (20 mg total) by mouth daily. 12/09/20   Johnson, Megan P, DO  Continuous Blood Gluc Sensor  (FREESTYLE LIBRE SENSOR SYSTEM) MISC Use 1 each as directed. Please place prior to office visit as directed 09/29/16   [provider]  empagliflozin (JARDIANCE) 25 MG TABS tablet Take 1 tablet (25 mg total) by mouth daily. 12/09/20   Park Liter P, DO  fluconazole (DIFLUCAN) 150 MG tablet  11/21/18   [provider]  ibuprofen (ADVIL) 400 MG tablet Take 400 mg by mouth 3 (three) times daily. 09/08/19   [provider]  insulin degludec (TRESIBA FLEXTOUCH) 200 UNIT/ML FlexTouch Pen Inject 20 Units into the skin daily. Patient not taking: Reported on 12/17/2020 12/10/20   [provider]  Iron, Ferrous Sulfate, 325 (65 Fe) MG TABS Take 325 mg by mouth in the morning and at bedtime. 12/10/20   Park Liter P, DO  Lancets (ONETOUCH DELICA PLUS [id number]) [place]  10/08/19   [provider]  lidocaine (XYLOCAINE) 5 % ointment as needed. 08/25/16   [provider]  lisinopril (ZESTRIL) 5 MG tablet Take 1 tablet (5 mg total) by mouth daily. 12/09/20   Valerie Roys, DO  metFORMIN (GLUCOPHAGE-XR) 500 MG 24 hr tablet Take 2 tablets (1,000 mg total) by mouth daily. 12/09/20   Park Liter P, DO  nystatin-triamcinolone ointment [facility]) as needed. 08/25/16   [provider]  ONE TOUCH ULTRA TEST test strip  02/13/18   [provider]  Semaglutide 0.25 or 0.5 MG/DOSE SOPN Inject 0.5 mg into the skin once a week. 09/28/16   [provider]    Family History  Problem Relation Age of Onset  . Diabetes Mother   . Hypertension Mother   . Skin cancer Mother   . Heart disease Father   . Alcohol abuse Father   . Hypertension Father   . Diabetes Sister   . Arthritis Sister        RA     Social History   Tobacco Use  . Smoking status: Never Smoker  . Smokeless tobacco: Never Used  Vaping Use  . Vaping Use: Never used  Substance Use Topics  . Alcohol use: No  . Drug use: No    Allergies as of 12/21/2020 - Review Complete  12/17/2020  Allergen Reaction Noted  . Actos [pioglitazone] Other (See Comments) 10/22/2015    Review of Systems:    All systems reviewed and negative except where noted in HPI.   Physical Exam:  There were no vitals taken for this visit. No LMP recorded. General:   Alert,  Well-developed, well-nourished, pleasant and cooperative in NAD Head:  Normocephalic and atraumatic. Eyes:  Sclera clear, no icterus.   Conjunctiva pink. Ears:  Normal auditory acuity. Neck:  Supple; no masses or thyromegaly. Lungs:  Respirations even and unlabored.  Clear throughout to auscultation.   No wheezes, crackles, or rhonchi. No acute distress. Heart:  Regular rate and rhythm; no murmurs, clicks, rubs, or gallops. Abdomen:  Normal bowel sounds.  No bruits.  Soft, non-tender and non-distended without masses, hepatosplenomegaly or hernias noted.  No guarding or rebound tenderness.  Negative Carnett sign.   Rectal:  Deferred.  Pulses:  Normal pulses noted. Extremities:  No clubbing or edema.  No cyanosis. Neurologic:  Alert and oriented x3;  grossly normal neurologically. Skin:  Intact without significant lesions or rashes.  No jaundice. Lymph Nodes:  No significant cervical adenopathy. Psych:  Alert and cooperative. Normal mood and affect.  Imaging Studies: No results found.  Assessment and Plan:   Ellen May is a 49 y.o. y/o female who comes in today with iron deficiency anemia with a low MCV and low iron with low ferritin.  The patient will be set up for an EGD and colonoscopy.  The patient has been told that if these are negative she may need to undergo a capsule endoscopy.  The patient will also have her blood sent off for celiac sprue to look for a possible cause of her not absorbing iron.  The patient has been explained the plan agrees with it    Lucilla Lame, MD. Marval Regal    Note: This dictation was prepared with Dragon dictation along with smaller phrase technology. Any transcriptional  errors that result from this process are unintentional.

## 2020-12-23 LAB — CELIAC DISEASE PANEL
Endomysial IgA: NEGATIVE
IgA/Immunoglobulin A, Serum: 120 mg/dL (ref 87–352)
Transglutaminase IgA: <2 U/mL (ref 0–3)

## 2020-12-27 ENCOUNTER — Telehealth: Payer: Self-pay

## 2020-12-27 NOTE — Telephone Encounter (Signed)
-----   Message from Lucilla Lame, MD sent at 12/23/2020 12:54 PM EDT ----- Let the patient know the blood test for an allergy to wheat was negative

## 2020-12-27 NOTE — Telephone Encounter (Signed)
Pt notified of lab results through mychart.  

## 2021-01-04 ENCOUNTER — Ambulatory Visit: Payer: BC Managed Care – PPO | Admitting: Registered Nurse

## 2021-01-04 ENCOUNTER — Encounter: Payer: Self-pay | Admitting: Gastroenterology

## 2021-01-04 ENCOUNTER — Encounter
Admission: RE | Disposition: A | Discharge status: Home / Self Care | Payer: Self-pay | Source: Home / Self Care | Attending: Gastroenterology

## 2021-01-04 ENCOUNTER — Other Ambulatory Visit: Payer: Self-pay

## 2021-01-04 ENCOUNTER — Ambulatory Visit
Admission: RE | Admit: 2021-01-04 | Discharge: 2021-01-04 | Disposition: A | Discharge status: Home / Self Care | Payer: BC Managed Care – PPO | Attending: Gastroenterology | Admitting: Gastroenterology

## 2021-01-04 DIAGNOSIS — K259 Gastric ulcer, unspecified as acute or chronic, without hemorrhage or perforation: Secondary | ICD-10-CM | POA: Insufficient documentation

## 2021-01-04 DIAGNOSIS — Z7984 Long term (current) use of oral hypoglycemic drugs: Secondary | ICD-10-CM | POA: Diagnosis not present

## 2021-01-04 DIAGNOSIS — K319 Disease of stomach and duodenum, unspecified: Secondary | ICD-10-CM | POA: Insufficient documentation

## 2021-01-04 DIAGNOSIS — Z888 Allergy status to other drugs, medicaments and biological substances status: Secondary | ICD-10-CM | POA: Diagnosis not present

## 2021-01-04 DIAGNOSIS — K641 Second degree hemorrhoids: Secondary | ICD-10-CM | POA: Insufficient documentation

## 2021-01-04 DIAGNOSIS — Z79899 Other long term (current) drug therapy: Secondary | ICD-10-CM | POA: Diagnosis not present

## 2021-01-04 DIAGNOSIS — D509 Iron deficiency anemia, unspecified: Secondary | ICD-10-CM | POA: Diagnosis present

## 2021-01-04 DIAGNOSIS — Z794 Long term (current) use of insulin: Secondary | ICD-10-CM | POA: Diagnosis not present

## 2021-01-04 DIAGNOSIS — K3189 Other diseases of stomach and duodenum: Secondary | ICD-10-CM

## 2021-01-04 DIAGNOSIS — D500 Iron deficiency anemia secondary to blood loss (chronic): Secondary | ICD-10-CM

## 2021-01-04 HISTORY — PX: COLONOSCOPY WITH PROPOFOL: SHX5780

## 2021-01-04 HISTORY — PX: ESOPHAGOGASTRODUODENOSCOPY (EGD) WITH PROPOFOL: SHX5813

## 2021-01-04 LAB — POCT PREGNANCY, URINE: Preg Test, Ur: NEGATIVE

## 2021-01-04 LAB — GLUCOSE, CAPILLARY: Glucose-Capillary: 151 mg/dL — ABNORMAL HIGH (ref 70–99)

## 2021-01-04 SURGERY — COLONOSCOPY WITH PROPOFOL
Anesthesia: General

## 2021-01-04 MED ORDER — DEXMEDETOMIDINE HCL 200 MCG/2ML IV SOLN
INTRAVENOUS | Status: DC | PRN
  Administered 2021-01-04: 20 ug via INTRAVENOUS

## 2021-01-04 MED ORDER — SODIUM CHLORIDE 0.9 % IV SOLN: INTRAVENOUS | Status: DC

## 2021-01-04 MED ORDER — LIDOCAINE HCL (CARDIAC) PF 100 MG/5ML IV SOSY
PREFILLED_SYRINGE | INTRAVENOUS | Status: DC | PRN
  Administered 2021-01-04: 100 mg via INTRAVENOUS

## 2021-01-04 MED ORDER — PROPOFOL 10 MG/ML IV BOLUS
INTRAVENOUS | Status: DC | PRN
  Administered 2021-01-04: 10 mg via INTRAVENOUS
  Administered 2021-01-04: 70 mg via INTRAVENOUS
  Administered 2021-01-04: 20 mg via INTRAVENOUS

## 2021-01-04 MED ORDER — PROPOFOL 500 MG/50ML IV EMUL
INTRAVENOUS | Status: DC | PRN
  Administered 2021-01-04: 150 ug/kg/min via INTRAVENOUS

## 2021-01-04 NOTE — Anesthesia Postprocedure Evaluation (Signed)
Anesthesia Post Note  Patient: Ellen May  Procedure(s) Performed: COLONOSCOPY WITH PROPOFOL ESOPHAGOGASTRODUODENOSCOPY (EGD) WITH PROPOFOL  Patient location during evaluation: Endoscopy Anesthesia Type: General Level of consciousness: awake and alert Pain management: pain level controlled Vital Signs Assessment: post-procedure vital signs reviewed and stable Respiratory status: spontaneous breathing, nonlabored ventilation, respiratory function stable and patient connected to nasal cannula oxygen Cardiovascular status: blood pressure returned to baseline and stable Postop Assessment: no apparent nausea or vomiting Anesthetic complications: no   No notable events documented.   Last Vitals:  Vitals:   01/04/21 0950 01/04/21 1000  BP: 96/65 102/77  Pulse: 86 69  Resp: 17 16  Temp:    SpO2: 99% 98%    Last Pain:  Vitals:   01/04/21 0940  TempSrc: Temporal  PainSc:                  Arita Miss

## 2021-01-04 NOTE — Op Note (Signed)
[facility] Gastroenterology Patient Name: Ellen May Procedure Date: 01/04/2021 9:12 AM MRN: [id number] Account #: [id number] Date of Birth: 1970-12-02 Admit Type: Outpatient Age: 50 Room: [facility] ENDO ROOM 4 Gender: Female Note Status: Finalized Procedure:             Upper GI endoscopy Indications:           Iron deficiency anemia Providers:             Lucilla Lame MD, MD Referring MD:          Valerie Roys (Referring MD) Medicines:             Propofol per Anesthesia Complications:         No immediate complications. Procedure:             Pre-Anesthesia Assessment:                        - Prior to the procedure, a History and Physical was                         performed, and patient medications and allergies were                         reviewed. The patient's tolerance of previous                         anesthesia was also reviewed. The risks and benefits                         of the procedure and the sedation options and risks                         were discussed with the patient. All questions were                         answered, and informed consent was obtained. Prior                         Anticoagulants: The patient has taken no previous                         anticoagulant or antiplatelet agents. ASA Grade                         Assessment: II - A patient with mild systemic disease.                         After reviewing the risks and benefits, the patient                         was deemed in satisfactory condition to undergo the                         procedure.                        After obtaining informed consent, the endoscope was  passed under direct vision. Throughout the procedure,                         the patient's blood pressure, pulse, and oxygen                         saturations were monitored continuously. The Endoscope                         was introduced through the mouth, and  advanced to the                         second part of duodenum. The upper GI endoscopy was                         accomplished without difficulty. The patient tolerated                         the procedure well. Findings:      The examined esophagus was normal.      A single localized erosion with no stigmata of recent bleeding was found       in the gastric antrum.      The examined duodenum was normal. Biopsies were taken with a cold       forceps for Helicobacter pylori testing. Impression:            - Normal esophagus.                        - Erosive gastropathy with no stigmata of recent                         bleeding.                        - Normal examined duodenum. Biopsied. Recommendation:        - Discharge patient to home.                        - Resume previous diet.                        - Continue present medications.                        - Perform a colonoscopy today. Procedure Code(s):     --- Professional ---                        [phone], Esophagogastroduodenoscopy, flexible,                         transoral; with biopsy, single or multiple Diagnosis Code(s):     --- Professional ---                        D50.9, Iron deficiency anemia, unspecified                        K31.89, Other diseases of stomach and duodenum CPT copyright 2019 American Medical Association. All rights reserved. The codes documented in this report are preliminary  and upon coder review may  be revised to meet current compliance requirements. Lucilla Lame MD, MD 01/04/2021 9:41:29 AM This report has been signed electronically. Number of Addenda: 0 Note Initiated On: 01/04/2021 9:12 AM Estimated Blood Loss:  Estimated blood loss: none.      [facility]

## 2021-01-04 NOTE — Op Note (Signed)
[facility] Gastroenterology Patient Name: Ellen May Procedure Date: 01/04/2021 9:12 AM MRN: [id number] Account #: [id number] Date of Birth: Sep 08, 1970 Admit Type: Outpatient Age: 50 Room: [facility] ENDO ROOM 4 Gender: Female Note Status: Finalized Procedure:             Colonoscopy Indications:           Iron deficiency anemia Providers:             Lucilla Lame MD, MD Referring MD:          Valerie Roys (Referring MD) Medicines:             Propofol per Anesthesia Complications:         No immediate complications. Procedure:             Pre-Anesthesia Assessment:                        - Prior to the procedure, a History and Physical was                         performed, and patient medications and allergies were                         reviewed. The patient's tolerance of previous                         anesthesia was also reviewed. The risks and benefits                         of the procedure and the sedation options and risks                         were discussed with the patient. All questions were                         answered, and informed consent was obtained. Prior                         Anticoagulants: The patient has taken no previous                         anticoagulant or antiplatelet agents. ASA Grade                         Assessment: II - A patient with mild systemic disease.                         After reviewing the risks and benefits, the patient                         was deemed in satisfactory condition to undergo the                         procedure.                        After obtaining informed consent, the colonoscope was  passed under direct vision. Throughout the procedure,                         the patient's blood pressure, pulse, and oxygen                         saturations were monitored continuously. The                         Colonoscope was introduced through the anus and                          advanced to the the cecum, identified by appendiceal                         orifice and ileocecal valve. The colonoscopy was                         performed without difficulty. The patient tolerated                         the procedure well. The quality of the bowel                         preparation was excellent. Findings:      The perianal and digital rectal examinations were normal.      Non-bleeding internal hemorrhoids were found during retroflexion. The       hemorrhoids were Grade II (internal hemorrhoids that prolapse but reduce       spontaneously). Impression:            - Non-bleeding internal hemorrhoids.                        - No specimens collected. Recommendation:        - Discharge patient to home.                        - Resume previous diet.                        - Continue present medications.                        - To visualize the small bowel, perform video capsule                         endoscopy at appointment to be scheduled. Procedure Code(s):     --- Professional ---                        [phone], Colonoscopy, flexible; diagnostic, including                         collection of specimen(s) by brushing or washing, when                         performed (separate procedure) Diagnosis Code(s):     --- Professional ---  D50.9, Iron deficiency anemia, unspecified CPT copyright 2019 American Medical Association. All rights reserved. The codes documented in this report are preliminary and upon coder review may  be revised to meet current compliance requirements. Lucilla Lame MD, MD 01/04/2021 9:42:50 AM This report has been signed electronically. Number of Addenda: 0 Note Initiated On: 01/04/2021 9:12 AM Scope Withdrawal Time: 0 hours 8 minutes 36 seconds  Total Procedure Duration: 0 hours 13 minutes 7 seconds  Estimated Blood Loss:  Estimated blood loss: none.      [facility]

## 2021-01-04 NOTE — Transfer of Care (Signed)
Immediate Anesthesia Transfer of Care Note  Patient: Ellen May  Procedure(s) Performed: COLONOSCOPY WITH PROPOFOL ESOPHAGOGASTRODUODENOSCOPY (EGD) WITH PROPOFOL  Patient Location: Endoscopy Unit  Anesthesia Type:General  Level of Consciousness: drowsy  Airway & Oxygen Therapy: Patient Spontanous Breathing  Post-op Assessment: Report given to RN and Post -op Vital signs reviewed and stable  Post vital signs: Reviewed and stable  Last Vitals:  Vitals Value Taken Time  BP    Temp    Pulse 85 01/04/21 0943  Resp 16 01/04/21 0943  SpO2 99 % 01/04/21 0943  Vitals shown include unvalidated device data.  Last Pain:  Vitals:   01/04/21 0847  TempSrc: Temporal  PainSc: 0-No pain         Complications: No notable events documented.

## 2021-01-04 NOTE — Anesthesia Preprocedure Evaluation (Signed)
Anesthesia Evaluation  Patient identified by MRN, date of birth, ID band Patient awake    Reviewed: Allergy & Precautions, NPO status , Patient's Chart, lab work & pertinent test results  History of Anesthesia Complications Negative for: history of anesthetic complications  Airway Mallampati: II  TM Distance: >3 FB Neck ROM: Full    Dental  (+) Teeth Intact Temporary crowns in place:   Pulmonary neg pulmonary ROS, neg sleep apnea, neg COPD, Patient abstained from smoking.Not current smoker,    Pulmonary exam normal breath sounds clear to auscultation       Cardiovascular Exercise Tolerance: Good METShypertension, (-) CAD and (-) Past MI (-) dysrhythmias  Rhythm:Regular Rate:Normal - Systolic murmurs    Neuro/Psych PSYCHIATRIC DISORDERS Anxiety Depression negative neurological ROS     GI/Hepatic neg GERD  ,(+)     (-) substance abuse  ,   Endo/Other  diabetes, Insulin Dependent  Renal/GU negative Renal ROS     Musculoskeletal   Abdominal   Peds  Hematology   Anesthesia Other Findings Past Medical History: No date: Diabetes mellitus without complication ([place]) 25/95/6387: Dysplastic nevus     Comment:  right lat hip above waistline No date: Hyperlipidemia No date: Hypertension No date: Situational anxiety  Reproductive/Obstetrics                            Anesthesia Physical Anesthesia Plan  ASA: 3  Anesthesia Plan: General   Post-op Pain Management:    Induction: Intravenous  PONV Risk Score and Plan: 3 and Ondansetron, Propofol infusion and TIVA  Airway Management Planned: Nasal Cannula  Additional Equipment: None  Intra-op Plan:   Post-operative Plan:   Informed Consent: I have reviewed the patients History and Physical, chart, labs and discussed the procedure including the risks, benefits and alternatives for the proposed anesthesia with the patient or authorized  representative who has indicated his/her understanding and acceptance.     Dental advisory given  Plan Discussed with: CRNA and Surgeon  Anesthesia Plan Comments: (Discussed risks of anesthesia with patient, including possibility of difficulty with spontaneous ventilation under anesthesia necessitating airway intervention, PONV, and rare risks such as cardiac or respiratory or neurological events. Patient understands.)        Anesthesia Quick Evaluation

## 2021-01-04 NOTE — Interval H&P Note (Signed)
Lucilla Lame, MD [address] [place], [place] [address] Phone:[phone] Fax : [phone]  Primary Care Physician:  Valerie Roys, DO Primary Gastroenterologist:  Dr. Allen Norris  Pre-Procedure History & Physical: HPI:  Ellen May is a 50 y.o. female is here for an endoscopy and colonoscopy.   Past Medical History:  Diagnosis Date   Diabetes mellitus without complication ([place])    Dysplastic nevus 09/18/2019   right lat hip above waistline   Hyperlipidemia    Hypertension    Situational anxiety     History reviewed. No pertinent surgical history.  Prior to Admission medications   Medication Sig Start Date End Date Taking? Authorizing Provider  atorvastatin (LIPITOR) 20 MG tablet Take 1 tablet (20 mg total) by mouth daily. 12/09/20  Yes Johnson, Megan P, DO  empagliflozin (JARDIANCE) 25 MG TABS tablet Take 1 tablet (25 mg total) by mouth daily. 12/09/20  Yes Johnson, Megan P, DO  insulin degludec (TRESIBA FLEXTOUCH) 200 UNIT/ML FlexTouch Pen Inject 20 Units into the skin daily. 12/10/20  Yes [provider]  lisinopril (ZESTRIL) 5 MG tablet Take 1 tablet (5 mg total) by mouth daily. 12/09/20  Yes Johnson, Megan P, DO  metFORMIN (GLUCOPHAGE-XR) 500 MG 24 hr tablet Take 2 tablets (1,000 mg total) by mouth daily. 12/09/20  Yes Johnson, Megan P, DO  Sod Picosulfate-Mag Ox-Cit Acd (CLENPIQ) 10-3.5-12 MG-GM -GM/160ML SOLN Take 320 mLs by mouth as directed. 12/21/20  Yes Lucilla Lame, MD  Continuous Blood Gluc Sensor (FREESTYLE LIBRE SENSOR SYSTEM) MISC Use 1 each as directed. Please place prior to office visit as directed 09/29/16   [provider]  fluconazole (DIFLUCAN) 150 MG tablet  11/21/18   [provider]  ibuprofen (ADVIL) 400 MG tablet Take 400 mg by mouth 3 (three) times daily. 09/08/19   [provider]  Iron, Ferrous Sulfate, 325 (65 Fe) MG TABS Take 325 mg by mouth in the morning and at bedtime. 12/10/20   Park Liter P, DO   Lancets (ONETOUCH DELICA PLUS [id number]) [place]  10/08/19   [provider]  lidocaine (XYLOCAINE) 5 % ointment as needed. 08/25/16   [provider]  nystatin-triamcinolone ointment (MYCOLOG) as needed. 08/25/16   [provider]  ONE TOUCH ULTRA TEST test strip  02/13/18   [provider]  Semaglutide 0.25 or 0.5 MG/DOSE SOPN Inject 0.5 mg into the skin once a week. 09/28/16   [provider]    Allergies as of 12/22/2020 - Review Complete 12/21/2020  Allergen Reaction Noted   Actos [pioglitazone] Other (See Comments) 10/22/2015    Family History  Problem Relation Age of Onset   Diabetes Mother    Hypertension Mother    Skin cancer Mother    Heart disease Father    Alcohol abuse Father    Hypertension Father    Diabetes Sister    Arthritis Sister        RA    Social History   Socioeconomic History   Marital status: Married    Spouse name: Not on file   Number of children: Not on file   Years of education: Not on file   Highest education level: Not on file  Occupational History   Not on file  Tobacco Use   Smoking status: Never   Smokeless tobacco: Never  Vaping Use   Vaping Use: Never used  Substance and Sexual Activity   Alcohol use: No   Drug use: No   Sexual  activity: Yes  Other Topics Concern   Not on file  Social History Narrative   Not on file   Social Determinants of Health   Financial Resource Strain: Not on file  Food Insecurity: Not on file  Transportation Needs: Not on file  Physical Activity: Not on file  Stress: Not on file  Social Connections: Not on file  Intimate Partner Violence: Not on file    Review of Systems: See HPI, otherwise negative ROS  Physical Exam: BP (!) 167/92   Pulse (!) 105   Temp (!) 97.1 F (36.2 C) (Temporal)   Resp 20   Ht 5\' 5"  (1.651 m)   Wt 65.8 kg   LMP 12/27/2020   SpO2 100%   BMI 24.13 kg/m  General:   Alert,  pleasant and cooperative in NAD Head:   Normocephalic and atraumatic. Neck:  Supple; no masses or thyromegaly. Lungs:  Clear throughout to auscultation.    Heart:  Regular rate and rhythm. Abdomen:  Soft, nontender and nondistended. Normal bowel sounds, without guarding, and without rebound.   Neurologic:  Alert and  oriented x4;  grossly normal neurologically.  Impression/Plan: Ellen May is here for an endoscopy and colonoscopy to be performed for anemia  Risks, benefits, limitations, and alternatives regarding  endoscopy and colonoscopy have been reviewed with the patient.  Questions have been answered.  All parties agreeable.   Lucilla Lame, MD  01/04/2021, 9:05 AM

## 2021-01-05 ENCOUNTER — Encounter: Payer: Self-pay | Admitting: Gastroenterology

## 2021-01-05 LAB — SURGICAL PATHOLOGY

## 2021-01-06 ENCOUNTER — Encounter: Payer: Self-pay | Admitting: Gastroenterology

## 2021-01-21 ENCOUNTER — Ambulatory Visit: Payer: Self-pay | Admitting: *Deleted

## 2021-01-21 ENCOUNTER — Encounter: Payer: Self-pay | Admitting: Family Medicine

## 2021-01-21 NOTE — Telephone Encounter (Signed)
Second attempt to contact patient- left message to call office 

## 2021-01-21 NOTE — Telephone Encounter (Signed)
FYI

## 2021-01-21 NOTE — Telephone Encounter (Signed)
If patient wants- can have virtual

## 2021-01-21 NOTE — Telephone Encounter (Signed)
Third attempt to reach patient- left message to call office.

## 2021-01-21 NOTE — Telephone Encounter (Signed)
Summary: Covid (Advice)   [phone] Pt has questions about covid, wants to speak to a nurse.     Attempted to call patient- left message to call office

## 2021-01-21 NOTE — Telephone Encounter (Signed)
Pt stated she did not need apt as she was only exposed and had no sx bout would before being around family.

## 2021-02-04 ENCOUNTER — Encounter: Payer: Self-pay | Admitting: Oncology

## 2021-02-04 LAB — HM MAMMOGRAPHY

## 2021-02-11 ENCOUNTER — Inpatient Hospital Stay: Payer: BC Managed Care – PPO | Attending: Oncology

## 2021-02-11 ENCOUNTER — Other Ambulatory Visit: Payer: Self-pay

## 2021-02-11 DIAGNOSIS — D509 Iron deficiency anemia, unspecified: Secondary | ICD-10-CM | POA: Insufficient documentation

## 2021-02-11 LAB — FERRITIN: Ferritin: 10 ng/mL — ABNORMAL LOW (ref 11–307)

## 2021-02-11 LAB — CBC WITH DIFFERENTIAL/PLATELET
Abs Immature Granulocytes: 0.05 10*3/uL (ref 0.00–0.07)
Basophils Absolute: 0.1 10*3/uL (ref 0.0–0.1)
Basophils Relative: 1 %
Eosinophils Absolute: 0.2 10*3/uL (ref 0.0–0.5)
Eosinophils Relative: 2 %
HCT: 41.7 % (ref 36.0–46.0)
Hemoglobin: 12.9 g/dL (ref 12.0–15.0)
Immature Granulocytes: 1 %
Lymphocytes Relative: 31 %
Lymphs Abs: 3 10*3/uL (ref 0.7–4.0)
MCH: 23.6 pg — ABNORMAL LOW (ref 26.0–34.0)
MCHC: 30.9 g/dL (ref 30.0–36.0)
MCV: 76.4 fL — ABNORMAL LOW (ref 80.0–100.0)
Monocytes Absolute: 0.6 10*3/uL (ref 0.1–1.0)
Monocytes Relative: 7 %
Neutro Abs: 5.6 10*3/uL (ref 1.7–7.7)
Neutrophils Relative %: 58 %
Platelets: 299 10*3/uL (ref 150–400)
RBC: 5.46 MIL/uL — ABNORMAL HIGH (ref 3.87–5.11)
RDW: 21.7 % — ABNORMAL HIGH (ref 11.5–15.5)
WBC: 9.7 10*3/uL (ref 4.0–10.5)
nRBC: 0 % (ref 0.0–0.2)

## 2021-02-11 LAB — IRON AND TIBC
Iron: 66 ug/dL (ref 28–170)
Saturation Ratios: 16 % (ref 10.4–31.8)
TIBC: 417 ug/dL (ref 250–450)
UIBC: 351 ug/dL

## 2021-02-14 ENCOUNTER — Encounter: Payer: Self-pay | Admitting: Oncology

## 2021-02-14 ENCOUNTER — Inpatient Hospital Stay (HOSPITAL_BASED_OUTPATIENT_CLINIC_OR_DEPARTMENT_OTHER): Payer: BC Managed Care – PPO | Admitting: Oncology

## 2021-02-14 DIAGNOSIS — D509 Iron deficiency anemia, unspecified: Secondary | ICD-10-CM | POA: Diagnosis not present

## 2021-02-14 NOTE — Progress Notes (Signed)
Attempted to call pt for Mychart visit x3, to review chart prior to appt but unable to reach her. Pt did conduct Mychart visit with MD.

## 2021-02-14 NOTE — Addendum Note (Signed)
Addended by: Earlie Server on: 02/14/2021 08:15 PM   Modules accepted: Orders

## 2021-02-14 NOTE — Progress Notes (Signed)
HEMATOLOGY-ONCOLOGY TeleHEALTH VISIT PROGRESS NOTE  I connected with Ellen May on 02/14/21  at  1:00 PM EDT by video enabled telemedicine visit and verified that I am speaking with the correct person using two identifiers. I discussed the limitations, risks, security and privacy concerns of performing an evaluation and management service by telemedicine and the availability of in-person appointments. The patient expressed understanding and agreed to proceed.   Other persons participating in the visit and their role in the encounter:  None  Patient's location: Home  Provider's location: office Chief Complaint: Iron deficiency anemia   INTERVAL HISTORY [place] is a 50 y.o. female who has above history reviewed by me today presents for follow up visit for management of IDA She feels tired. Menstrual period is sometimes heavy.Established care with gyn 01/04/2021 EGD and colonoscopy showed erosive gastropathy with no stigmata of recent bleeding. Colonoscopy showed non bleeding internal hemorrhoids.    Review of Systems  Constitutional:  Positive for fatigue. Negative for appetite change, chills and fever.  HENT:   Negative for hearing loss and voice change.   Eyes:  Negative for eye problems.  Respiratory:  Negative for chest tightness and cough.   Cardiovascular:  Negative for chest pain.  Gastrointestinal:  Negative for abdominal distention, abdominal pain and blood in stool.  Endocrine: Negative for hot flashes.  Genitourinary:  Positive for menstrual problem. Negative for difficulty urinating and frequency.   Musculoskeletal:  Negative for arthralgias.  Skin:  Negative for itching and rash.  Neurological:  Negative for extremity weakness.  Hematological:  Negative for adenopathy.  Psychiatric/Behavioral:  Negative for confusion.    Past Medical History:  Diagnosis Date   Diabetes mellitus without complication ([place])    Dysplastic nevus 09/18/2019   right lat hip above  waistline   Hyperlipidemia    Hypertension    Situational anxiety    Past Surgical History:  Procedure Laterality Date   COLONOSCOPY WITH PROPOFOL N/A 01/04/2021   Procedure: COLONOSCOPY WITH PROPOFOL;  Surgeon: Lucilla Lame, MD;  Location[facility] ENDOSCOPY;  Service: Endoscopy;  Laterality: N/A;   ESOPHAGOGASTRODUODENOSCOPY (EGD) WITH PROPOFOL N/A 01/04/2021   Procedure: ESOPHAGOGASTRODUODENOSCOPY (EGD) WITH PROPOFOL;  Surgeon: Lucilla Lame, MD;  Location: [facility] ENDOSCOPY;  Service: Endoscopy;  Laterality: N/A;    Family History  Problem Relation Age of Onset   Diabetes Mother    Hypertension Mother    Skin cancer Mother    Heart disease Father    Alcohol abuse Father    Hypertension Father    Diabetes Sister    Arthritis Sister        RA    Social History   Socioeconomic History   Marital status: Married    Spouse name: Not on file   Number of children: Not on file   Years of education: Not on file   Highest education level: Not on file  Occupational History   Not on file  Tobacco Use   Smoking status: Never   Smokeless tobacco: Never  Vaping Use   Vaping Use: Never used  Substance and Sexual Activity   Alcohol use: No   Drug use: No   Sexual activity: Yes  Other Topics Concern   Not on file  Social History Narrative   Not on file   Social Determinants of Health   Financial Resource Strain: Not on file  Food Insecurity: Not on file  Transportation Needs: Not on file  Physical Activity: Not on file  Stress: Not on  file  Social Connections: Not on file  Intimate Partner Violence: Not on file    Current Outpatient Medications on File Prior to Visit  Medication Sig Dispense Refill   atorvastatin (LIPITOR) 20 MG tablet Take 1 tablet (20 mg total) by mouth daily. 90 tablet 3   Continuous Blood Gluc Sensor (FREESTYLE LIBRE SENSOR SYSTEM) MISC Use 1 each as directed. Please place prior to office visit as directed     empagliflozin (JARDIANCE) 25 MG TABS tablet Take  1 tablet (25 mg total) by mouth daily. 30 tablet 2   fluconazole (DIFLUCAN) 150 MG tablet      ibuprofen (ADVIL) 400 MG tablet Take 400 mg by mouth 3 (three) times daily.     insulin degludec (TRESIBA FLEXTOUCH) 200 UNIT/ML FlexTouch Pen Inject 20 Units into the skin daily.     Iron, Ferrous Sulfate, 325 (65 Fe) MG TABS Take 325 mg by mouth in the morning and at bedtime. 60 tablet 3   Lancets (ONETOUCH DELICA PLUS [id number]) MISC      lidocaine (XYLOCAINE) 5 % ointment as needed.     lisinopril (ZESTRIL) 5 MG tablet Take 1 tablet (5 mg total) by mouth daily. 30 tablet 3   metFORMIN (GLUCOPHAGE-XR) 500 MG 24 hr tablet Take 2 tablets (1,000 mg total) by mouth daily. 120 tablet 2   nystatin-triamcinolone ointment (MYCOLOG) as needed.     ONE TOUCH ULTRA TEST test strip      Semaglutide 0.25 or 0.5 MG/DOSE SOPN Inject 0.5 mg into the skin once a week.     Sod Picosulfate-Mag Ox-Cit Acd (CLENPIQ) 10-3.5-12 MG-GM -GM/160ML SOLN Take 320 mLs by mouth as directed. 320 mL 0   No current facility-administered medications on file prior to visit.    Allergies  Allergen Reactions   Actos [Pioglitazone] Other (See Comments)    Weight gain       Observations/Objective: There were no vitals filed for this visit. There is no height or weight on file to calculate BMI.  Physical Exam Neurological:     Mental Status: She is alert.    CBC    Component Value Date/Time   WBC 9.7 02/11/2021 1403   RBC 5.46 (H) 02/11/2021 1403   HGB 12.9 02/11/2021 1403   HGB 8.9 (L) 12/13/2020 0807   HCT 41.7 02/11/2021 1403   HCT 33.4 (L) 12/13/2020 0807   PLT 299 02/11/2021 1403   PLT 303 12/13/2020 0807   MCV 76.4 (L) 02/11/2021 1403   MCV 65 (L) 12/13/2020 0807   MCH 23.6 (L) 02/11/2021 1403   MCHC 30.9 02/11/2021 1403   RDW 21.7 (H) 02/11/2021 1403   RDW 16.7 (H) 12/13/2020 0807   LYMPHSABS 3.0 02/11/2021 1403   LYMPHSABS 2.3 12/13/2020 0807   MONOABS 0.6 02/11/2021 1403   EOSABS 0.2 02/11/2021 1403    EOSABS 0.1 12/13/2020 0807   BASOSABS 0.1 02/11/2021 1403   BASOSABS 0.1 12/13/2020 0807    CMP     Component Value Date/Time   NA 136 (A) 12/03/2020 0000   K 4.1 12/03/2020 0000   CL 103 12/03/2020 0000   CO2 27 (A) 12/03/2020 0000   GLUCOSE 223 (H) 03/19/2017 0952   BUN 8 12/03/2020 0000   CREATININE 0.5 12/03/2020 0000   CREATININE 0.47 (L) 03/19/2017 0952   CALCIUM 8.7 03/19/2017 0952   PROT 7.1 12/17/2020 1149   PROT 6.3 03/19/2017 0952   ALBUMIN 4.0 12/17/2020 1149   ALBUMIN 4.0 03/19/2017 0952   AST 21  12/17/2020 1149   ALT 27 12/17/2020 1149   ALKPHOS 70 12/17/2020 1149   BILITOT 0.6 12/17/2020 1149   BILITOT 0.3 03/19/2017 0952   GFRNONAA 120 03/19/2017 0952   GFRAA 138 03/19/2017 0952     Assessment and Plan: 1. Iron deficiency anemia, unspecified iron deficiency anemia type     Labs are reviewed and discussed with patient. Persistent iron deficient. Recommend  IV iron with Venofer '200mg'$  weekly x 3 doses. Allergy reactions/infusion reaction including anaphylactic reaction discussed with patient. Other side effects include but not limited to high blood pressure, skin rash, weight gain, leg swelling, etc. Patient voices understanding and willing to proceed.  Follow Up Instructions: 3 months.    I discussed the assessment and treatment plan with the patient. The patient was provided an opportunity to ask questions and all were answered. The patient agreed with the plan and demonstrated an understanding of the instructions.  The patient was advised to call back or seek an in-person evaluation if the symptoms worsen or if the condition fails to improve as anticipated.   Earlie Server, MD 02/14/2021 8:05 PM

## 2021-02-17 ENCOUNTER — Inpatient Hospital Stay: Payer: BC Managed Care – PPO

## 2021-02-24 ENCOUNTER — Inpatient Hospital Stay: Payer: BC Managed Care – PPO | Attending: Oncology

## 2021-02-24 VITALS — BP 155/74 | HR 100 | Temp 97.0°F | Resp 18

## 2021-02-24 DIAGNOSIS — D509 Iron deficiency anemia, unspecified: Secondary | ICD-10-CM | POA: Diagnosis not present

## 2021-02-24 MED ORDER — IRON SUCROSE 20 MG/ML IV SOLN
200.0000 mg | Freq: Once | INTRAVENOUS | Status: AC
  Administered 2021-02-24: 200 mg via INTRAVENOUS
  Filled 2021-02-24: qty 10

## 2021-02-24 MED ORDER — SODIUM CHLORIDE 0.9 % IV SOLN
Freq: Once | INTRAVENOUS | Status: AC
  Filled 2021-02-24: qty 250

## 2021-02-24 MED ORDER — SODIUM CHLORIDE 0.9 % IV SOLN: 200.0000 mg | Freq: Once | INTRAVENOUS | Status: DC

## 2021-02-24 NOTE — Patient Instructions (Signed)

## 2021-03-03 ENCOUNTER — Inpatient Hospital Stay: Payer: BC Managed Care – PPO

## 2021-03-03 VITALS — BP 149/88 | HR 92 | Temp 97.0°F | Resp 18

## 2021-03-03 DIAGNOSIS — D509 Iron deficiency anemia, unspecified: Secondary | ICD-10-CM

## 2021-03-03 MED ORDER — SODIUM CHLORIDE 0.9 % IV SOLN: 200.0000 mg | Freq: Once | INTRAVENOUS | Status: DC

## 2021-03-03 MED ORDER — SODIUM CHLORIDE 0.9 % IV SOLN
Freq: Once | INTRAVENOUS | Status: AC
  Filled 2021-03-03: qty 250

## 2021-03-03 MED ORDER — IRON SUCROSE 20 MG/ML IV SOLN
200.0000 mg | Freq: Once | INTRAVENOUS | Status: AC
  Administered 2021-03-03: 200 mg via INTRAVENOUS
  Filled 2021-03-03: qty 10

## 2021-03-03 NOTE — Patient Instructions (Signed)

## 2021-03-08 ENCOUNTER — Telehealth: Payer: Self-pay | Admitting: Oncology

## 2021-03-08 NOTE — Telephone Encounter (Signed)
Patient wants to verify that it is ok to take Advil before her iron infusion tomorrow morning. She states that she had a "terrible headache" after last weeks' venofer.   Patient stated that a MyChart response would be fine.

## 2021-03-08 NOTE — Telephone Encounter (Signed)
Ok to take Advil prior to infusion. Replied to pt via Mychart.

## 2021-03-09 ENCOUNTER — Inpatient Hospital Stay: Payer: BC Managed Care – PPO

## 2021-03-09 ENCOUNTER — Other Ambulatory Visit: Payer: Self-pay

## 2021-03-09 VITALS — BP 147/89 | HR 89 | Resp 18

## 2021-03-09 DIAGNOSIS — D509 Iron deficiency anemia, unspecified: Secondary | ICD-10-CM

## 2021-03-09 MED ORDER — IRON SUCROSE 20 MG/ML IV SOLN
200.0000 mg | Freq: Once | INTRAVENOUS | Status: AC
  Administered 2021-03-09: 200 mg via INTRAVENOUS
  Filled 2021-03-09: qty 10

## 2021-03-09 MED ORDER — SODIUM CHLORIDE 0.9 % IV SOLN: 200.0000 mg | Freq: Once | INTRAVENOUS | Status: DC

## 2021-03-09 MED ORDER — SODIUM CHLORIDE 0.9 % IV SOLN
Freq: Once | INTRAVENOUS | Status: AC
  Filled 2021-03-09: qty 250

## 2021-03-09 NOTE — Patient Instructions (Addendum)
[facility]  Discharge Instructions: Thank you for choosing [facility] to provide your oncology and hematology care.  If you have a lab appointment with the Cancer Center, please go directly to the Cancer Center and check in at the registration area.  Wear comfortable clothing and clothing appropriate for easy access to any Portacath or PICC line.   We strive to give you quality time with your provider. You may need to reschedule your appointment if you arrive late (15 or more minutes).  Arriving late affects you and other patients whose appointments are after yours.  Also, if you miss three or more appointments without notifying the office, you may be dismissed from the clinic at the provider's discretion.      For prescription refill requests, have your pharmacy contact our office and allow 72 hours for refills to be completed.    Today you received the following chemotherapy and/or immunotherapy agents VENOFER      To help prevent nausea and vomiting after your treatment, we encourage you to take your nausea medication as directed.  BELOW ARE SYMPTOMS THAT SHOULD BE REPORTED IMMEDIATELY: *FEVER GREATER THAN 100.4 F (38 C) OR HIGHER *CHILLS OR SWEATING *NAUSEA AND VOMITING THAT IS NOT CONTROLLED WITH YOUR NAUSEA MEDICATION *UNUSUAL SHORTNESS OF BREATH *UNUSUAL BRUISING OR BLEEDING *URINARY PROBLEMS (pain or burning when urinating, or frequent urination) *BOWEL PROBLEMS (unusual diarrhea, constipation, pain near the anus) TENDERNESS IN MOUTH AND THROAT WITH OR WITHOUT PRESENCE OF ULCERS (sore throat, sores in mouth, or a toothache) UNUSUAL RASH, SWELLING OR PAIN  UNUSUAL VAGINAL DISCHARGE OR ITCHING   Items with * indicate a potential emergency and should be followed up as soon as possible or go to the Emergency Department if any problems should occur.  Please show the CHEMOTHERAPY ALERT CARD or IMMUNOTHERAPY ALERT CARD at check-in to  the Emergency Department and triage nurse.  Should you have questions after your visit or need to cancel or reschedule your appointment, please contact [facility]  [phone] and follow the prompts.  Office hours are 8:00 a.m. to 4:30 p.m. Monday - Friday. Please note that voicemails left after 4:00 p.m. may not be returned until the following business day.  We are closed weekends and major holidays. You have access to a nurse at all times for urgent questions. Please call the main number to the clinic [phone] and follow the prompts.  For any non-urgent questions, you may also contact your provider using MyChart. We now offer e-Visits for anyone 18 and older to request care online for non-urgent symptoms. For details visit [email or web address].   Also download the MyChart app! Go to the app store, search "MyChart", open the app, select [facility], and log in with your MyChart username and password.  Due to Covid, a mask is required upon entering the hospital/clinic. If you do not have a mask, one will be given to you upon arrival. For doctor visits, patients may have 1 support person aged 18 or older with them. For treatment visits, patients cannot have anyone with them due to current Covid guidelines and our immunocompromised population.   Iron Sucrose injection What is this medication? IRON SUCROSE (AHY ern SOO krohs) is an iron complex. Iron is used to make healthy red blood cells, which carry oxygen and nutrients throughout the body. This medicine is used to treat iron deficiency anemia in people with chronickidney disease. This medicine may be used for other   purposes; ask your health care provider orpharmacist if you have questions. COMMON BRAND NAME(S): Venofer What should I tell my care team before I take this medication? They need to know if you have any of these conditions: anemia not caused by low iron levels heart disease high levels of  iron in the blood kidney disease liver disease an unusual or allergic reaction to iron, other medicines, foods, dyes, or preservatives pregnant or trying to get pregnant breast-feeding How should I use this medication? This medicine is for infusion into a vein. It is given by a health careprofessional in a hospital or clinic setting. Talk to your pediatrician regarding the use of this medicine in children. While this drug may be prescribed for children as young as 2 years for selectedconditions, precautions do apply. Overdosage: If you think you have taken too much of this medicine contact apoison control center or emergency room at once. NOTE: This medicine is only for you. Do not share this medicine with others. What if I miss a dose? It is important not to miss your dose. Call your doctor or health careprofessional if you are unable to keep an appointment. What may interact with this medication? Do not take this medicine with any of the following medications: deferoxamine dimercaprol other iron products This medicine may also interact with the following medications: chloramphenicol deferasirox This list may not describe all possible interactions. Give your health care provider a list of all the medicines, herbs, non-prescription drugs, or dietary supplements you use. Also tell them if you smoke, drink alcohol, or use illegaldrugs. Some items may interact with your medicine. What should I watch for while using this medication? Visit your doctor or healthcare professional regularly. Tell your doctor or healthcare professional if your symptoms do not start to get better or if theyget worse. You may need blood work done while you are taking this medicine. You may need to follow a special diet. Talk to your doctor. Foods that contain iron include: whole grains/cereals, dried fruits, beans, or peas, leafy greenvegetables, and organ meats (liver, kidney). What side effects may I notice from  receiving this medication? Side effects that you should report to your doctor or health care professionalas soon as possible: allergic reactions like skin rash, itching or hives, swelling of the face, lips, or tongue breathing problems changes in blood pressure cough fast, irregular heartbeat feeling faint or lightheaded, falls fever or chills flushing, sweating, or hot feelings joint or muscle aches/pains seizures swelling of the ankles or feet unusually weak or tired Side effects that usually do not require medical attention (report to yourdoctor or health care professional if they continue or are bothersome): diarrhea feeling achy headache irritation at site where injected nausea, vomiting stomach upset tiredness This list may not describe all possible side effects. Call your doctor for medical advice about side effects. You may report side effects to FDA at1-800-FDA-1088. Where should I keep my medication? This drug is given in a hospital or clinic and will not be stored at home. NOTE: This sheet is a summary. It may not cover all possible information. If you have questions about this medicine, talk to your doctor, pharmacist, orhealth care provider.  2022 Elsevier/Gold Standard (2011-04-20 17:14:35)  

## 2021-03-10 ENCOUNTER — Other Ambulatory Visit: Payer: Self-pay | Admitting: Obstetrics and Gynecology

## 2021-03-10 ENCOUNTER — Inpatient Hospital Stay: Payer: BC Managed Care – PPO

## 2021-03-18 ENCOUNTER — Encounter (HOSPITAL_BASED_OUTPATIENT_CLINIC_OR_DEPARTMENT_OTHER): Payer: Self-pay | Admitting: Obstetrics and Gynecology

## 2021-03-18 ENCOUNTER — Other Ambulatory Visit: Payer: Self-pay

## 2021-03-18 NOTE — Progress Notes (Signed)
Spoke w/ via phone for pre-op interview---pt Lab needs dos----  urine poct             Lab results------lab appt 03-21-2021 for cbc bmp ekg  COVID test -----patient states asymptomatic no test needed Arrive at -------600 am 03-23-2021 NPO after MN NO Solid Food.  Clear liquids from MN until---500 am Med rec completed Medications to take morning of surgery -----atorvastatin Diabetic medication -----none dos Patient instructed no nail polish to be worn day of surgery Patient instructed to bring photo id and insurance card day of surgery Patient aware to have Driver (ride ) / caregiver  spouse erik   for 24 hours after surgery  Patient Special Instructions -----none Pre-Op special Istructions -----none Patient verbalized understanding of instructions that were given at this phone interview. Patient denies shortness of breath, chest pain, fever, cough at this phone interview.

## 2021-03-21 ENCOUNTER — Encounter (HOSPITAL_COMMUNITY)
Admission: RE | Admit: 2021-03-21 | Discharge: 2021-03-21 | Disposition: A | Discharge status: Home / Self Care | Payer: BC Managed Care – PPO | Source: Ambulatory Visit | Attending: Obstetrics and Gynecology | Admitting: Obstetrics and Gynecology

## 2021-03-21 ENCOUNTER — Other Ambulatory Visit: Payer: Self-pay

## 2021-03-21 DIAGNOSIS — E119 Type 2 diabetes mellitus without complications: Secondary | ICD-10-CM | POA: Diagnosis not present

## 2021-03-21 DIAGNOSIS — N921 Excessive and frequent menstruation with irregular cycle: Secondary | ICD-10-CM | POA: Diagnosis present

## 2021-03-21 DIAGNOSIS — Z888 Allergy status to other drugs, medicaments and biological substances status: Secondary | ICD-10-CM | POA: Diagnosis not present

## 2021-03-21 DIAGNOSIS — Z833 Family history of diabetes mellitus: Secondary | ICD-10-CM | POA: Diagnosis not present

## 2021-03-21 DIAGNOSIS — Z8249 Family history of ischemic heart disease and other diseases of the circulatory system: Secondary | ICD-10-CM | POA: Diagnosis not present

## 2021-03-21 DIAGNOSIS — Z01818 Encounter for other preprocedural examination: Secondary | ICD-10-CM | POA: Insufficient documentation

## 2021-03-21 DIAGNOSIS — Z8616 Personal history of COVID-19: Secondary | ICD-10-CM | POA: Diagnosis not present

## 2021-03-21 DIAGNOSIS — N840 Polyp of corpus uteri: Secondary | ICD-10-CM | POA: Diagnosis not present

## 2021-03-21 DIAGNOSIS — Z8261 Family history of arthritis: Secondary | ICD-10-CM | POA: Diagnosis not present

## 2021-03-21 LAB — CBC
HCT: 47.5 % — ABNORMAL HIGH (ref 36.0–46.0)
Hemoglobin: 14.8 g/dL (ref 12.0–15.0)
MCH: 24.9 pg — ABNORMAL LOW (ref 26.0–34.0)
MCHC: 31.2 g/dL (ref 30.0–36.0)
MCV: 79.8 fL — ABNORMAL LOW (ref 80.0–100.0)
Platelets: 241 10*3/uL (ref 150–400)
RBC: 5.95 MIL/uL — ABNORMAL HIGH (ref 3.87–5.11)
RDW: 16.5 % — ABNORMAL HIGH (ref 11.5–15.5)
WBC: 8.9 10*3/uL (ref 4.0–10.5)
nRBC: 0 % (ref 0.0–0.2)

## 2021-03-21 LAB — BASIC METABOLIC PANEL
Anion gap: 7 (ref 5–15)
BUN: 15 mg/dL (ref 6–20)
CO2: 23 mmol/L (ref 22–32)
Calcium: 9.6 mg/dL (ref 8.9–10.3)
Chloride: 107 mmol/L (ref 98–111)
Creatinine, Ser: 0.46 mg/dL (ref 0.44–1.00)
GFR, Estimated: >60 mL/min (ref >60)
Glucose, Bld: 192 mg/dL — ABNORMAL HIGH (ref 70–99)
Potassium: 4.6 mmol/L (ref 3.5–5.1)
Sodium: 137 mmol/L (ref 135–145)

## 2021-03-22 NOTE — Patient Instructions (Signed)
hi

## 2021-03-22 NOTE — Anesthesia Preprocedure Evaluation (Addendum)
Anesthesia Evaluation  Patient identified by MRN, date of birth, ID band Patient awake    Reviewed: Allergy & Precautions, NPO status , Patient's Chart, lab work & pertinent test results  Airway Mallampati: II  TM Distance: >3 FB Neck ROM: Full    Dental no notable dental hx. (+) Caps, Teeth Intact, Dental Advisory Given   Pulmonary neg pulmonary ROS,    Pulmonary exam normal breath sounds clear to auscultation       Cardiovascular hypertension, Pt. on medications Normal cardiovascular exam Rhythm:Regular Rate:Normal     Neuro/Psych Anxiety    GI/Hepatic   Endo/Other  diabetes  Renal/GU      Musculoskeletal negative musculoskeletal ROS (+)   Abdominal   Peds  Hematology Lab Results      Component                Value               Date                      WBC                      8.9                 03/21/2021                HGB                      14.8                03/21/2021                HCT                      47.5 (H)            03/21/2021                MCV                      79.8 (L)            03/21/2021                PLT                      241                 03/21/2021              Anesthesia Other Findings   Reproductive/Obstetrics                            Anesthesia Physical Anesthesia Plan  ASA: 2  Anesthesia Plan: General   Post-op Pain Management:    Induction:   PONV Risk Score and Plan: 4 or greater and Treatment may vary due to age or medical condition, Midazolam, Ondansetron and Dexamethasone  Airway Management Planned: LMA  Additional Equipment:   Intra-op Plan:   Post-operative Plan:   Informed Consent: I have reviewed the patients History and Physical, chart, labs and discussed the procedure including the risks, benefits and alternatives for the proposed anesthesia with the patient or authorized representative who has indicated his/her  understanding and acceptance.     Dental advisory given  Plan Discussed with:   Anesthesia  Plan Comments: (LMA GA)       Anesthesia Quick Evaluation

## 2021-03-22 NOTE — H&P (Signed)
Ellen May is an 50 y.o. female. Menorrhagia for Diag HS,  EAB Pertinent Gynecological History: Menses: flow is moderate Bleeding: dysfunctional uterine bleeding Contraception: none DES exposure: denies Blood transfusions: none Sexually transmitted diseases: no past history Previous GYN Procedures:  na   Last mammogram: normal Date: 2022 Last pap: normal Date: 2022 OB History: G1, P1   Menstrual History: Menarche age: 36 Patient's last menstrual period was 02/15/2021 (approximate).    Past Medical History:  Diagnosis Date   Anemia    COVID 2020   COVID 2021   cough, sinus trouble, body aches, low fever x 2 weeks all symptoms resolved   Hyperlipidemia    Hypertension    Situational anxiety    Type 2 dm     Past Surgical History:  Procedure Laterality Date   COLONOSCOPY WITH PROPOFOL N/A 01/04/2021   Procedure: COLONOSCOPY WITH PROPOFOL;  Surgeon: Lucilla Lame, MD;  Location: [facility] ENDOSCOPY;  Service: Endoscopy;  Laterality: N/A;   ESOPHAGOGASTRODUODENOSCOPY (EGD) WITH PROPOFOL N/A 01/04/2021   Procedure: ESOPHAGOGASTRODUODENOSCOPY (EGD) WITH PROPOFOL;  Surgeon: Lucilla Lame, MD;  Location: [facility] ENDOSCOPY;  Service: Endoscopy;  Laterality: N/A;    Family History  Problem Relation Age of Onset   Diabetes Mother    Hypertension Mother    Skin cancer Mother    Heart disease Father    Alcohol abuse Father    Hypertension Father    Diabetes Sister    Arthritis Sister        RA    Social History:  reports that she has never smoked. She has never used smokeless tobacco. She reports that she does not drink alcohol and does not use drugs.  Allergies:  Allergies  Allergen Reactions   Actos [Pioglitazone] Other (See Comments)    Weight gain    No medications prior to admission.    Review of Systems  Constitutional: Negative.   All other systems reviewed and are negative.  Height '5\' 5"'$  (1.651 m), weight 65.8 kg, last menstrual period 02/15/2021. Physical  Exam Constitutional:      Appearance: Normal appearance. She is normal weight.  HENT:     Head: Normocephalic and atraumatic.  Cardiovascular:     Rate and Rhythm: Normal rate and regular rhythm.  Pulmonary:     Effort: Pulmonary effort is normal.     Breath sounds: Normal breath sounds.  Abdominal:     General: Abdomen is flat.     Palpations: Abdomen is soft.  Genitourinary:    General: Normal vulva.  Musculoskeletal:        General: Normal range of motion.     Cervical back: Normal range of motion and neck supple.  Skin:    General: Skin is warm and dry.  Neurological:     General: No focal deficit present.     Mental Status: She is alert.  Psychiatric:        Mood and Affect: Mood normal.        Behavior: Behavior normal.    No results found for this or any previous visit (from the past 24 hour(s)).  No results found.  Assessment/Plan: Menometrorrhagia Diag HS, D&C, myosure, EAB. Risks of anesthesia, infection, bleeding , uterine perforation with need for repair discussed.  Risks of failed procedure due to inability to resect large SM fibroid noted.  Pt acknowledges and will proceed. Consent done.   Niley Helbig J 03/22/2021, 9:08 PM

## 2021-03-23 ENCOUNTER — Encounter (HOSPITAL_BASED_OUTPATIENT_CLINIC_OR_DEPARTMENT_OTHER): Payer: Self-pay | Admitting: Obstetrics and Gynecology

## 2021-03-23 ENCOUNTER — Ambulatory Visit (HOSPITAL_BASED_OUTPATIENT_CLINIC_OR_DEPARTMENT_OTHER): Payer: BC Managed Care – PPO | Admitting: Anesthesiology

## 2021-03-23 ENCOUNTER — Encounter (HOSPITAL_BASED_OUTPATIENT_CLINIC_OR_DEPARTMENT_OTHER)
Admission: RE | Disposition: A | Discharge status: Home / Self Care | Payer: Self-pay | Source: Home / Self Care | Attending: Obstetrics and Gynecology

## 2021-03-23 ENCOUNTER — Ambulatory Visit (HOSPITAL_BASED_OUTPATIENT_CLINIC_OR_DEPARTMENT_OTHER)
Admission: RE | Admit: 2021-03-23 | Discharge: 2021-03-23 | Disposition: A | Discharge status: Home / Self Care | Payer: BC Managed Care – PPO | Attending: Obstetrics and Gynecology | Admitting: Obstetrics and Gynecology

## 2021-03-23 DIAGNOSIS — Z8249 Family history of ischemic heart disease and other diseases of the circulatory system: Secondary | ICD-10-CM | POA: Insufficient documentation

## 2021-03-23 DIAGNOSIS — N921 Excessive and frequent menstruation with irregular cycle: Secondary | ICD-10-CM | POA: Insufficient documentation

## 2021-03-23 DIAGNOSIS — Z8261 Family history of arthritis: Secondary | ICD-10-CM | POA: Insufficient documentation

## 2021-03-23 DIAGNOSIS — Z833 Family history of diabetes mellitus: Secondary | ICD-10-CM | POA: Insufficient documentation

## 2021-03-23 DIAGNOSIS — E119 Type 2 diabetes mellitus without complications: Secondary | ICD-10-CM | POA: Insufficient documentation

## 2021-03-23 DIAGNOSIS — Z8616 Personal history of COVID-19: Secondary | ICD-10-CM | POA: Insufficient documentation

## 2021-03-23 DIAGNOSIS — N840 Polyp of corpus uteri: Secondary | ICD-10-CM | POA: Insufficient documentation

## 2021-03-23 DIAGNOSIS — Z888 Allergy status to other drugs, medicaments and biological substances status: Secondary | ICD-10-CM | POA: Insufficient documentation

## 2021-03-23 HISTORY — DX: Anemia, unspecified: D64.9

## 2021-03-23 HISTORY — PX: DILATATION & CURETTAGE/HYSTEROSCOPY WITH MYOSURE: SHX6511

## 2021-03-23 HISTORY — PX: HYSTEROSCOPY WITH NOVASURE: SHX5574

## 2021-03-23 LAB — GLUCOSE, CAPILLARY
Glucose-Capillary: 196 mg/dL — ABNORMAL HIGH (ref 70–99)
Glucose-Capillary: 225 mg/dL — ABNORMAL HIGH (ref 70–99)

## 2021-03-23 LAB — POCT PREGNANCY, URINE: Preg Test, Ur: NEGATIVE

## 2021-03-23 SURGERY — DILATATION & CURETTAGE/HYSTEROSCOPY WITH MYOSURE
Anesthesia: General | Site: Uterus

## 2021-03-23 MED ORDER — SODIUM CHLORIDE 0.9 % IR SOLN
Status: DC | PRN
  Administered 2021-03-23: 3000 mL

## 2021-03-23 MED ORDER — TRAMADOL HCL 50 MG PO TABS: 50.0000 mg | ORAL_TABLET | Freq: Four times a day (QID) | ORAL | 0 refills | Status: DC | PRN

## 2021-03-23 MED ORDER — AMISULPRIDE (ANTIEMETIC) 5 MG/2ML IV SOLN
10.0000 mg | Freq: Once | INTRAVENOUS | Status: AC | PRN
  Administered 2021-03-23: 10 mg via INTRAVENOUS

## 2021-03-23 MED ORDER — FENTANYL CITRATE (PF) 100 MCG/2ML IJ SOLN
INTRAMUSCULAR | Status: AC
  Filled 2021-03-23: qty 2

## 2021-03-23 MED ORDER — LIDOCAINE HCL (CARDIAC) PF 100 MG/5ML IV SOSY
PREFILLED_SYRINGE | INTRAVENOUS | Status: DC | PRN
  Administered 2021-03-23: 100 mg via INTRAVENOUS

## 2021-03-23 MED ORDER — CEFAZOLIN SODIUM-DEXTROSE 2-4 GM/100ML-% IV SOLN
2.0000 g | INTRAVENOUS | Status: AC
  Administered 2021-03-23: 2 g via INTRAVENOUS

## 2021-03-23 MED ORDER — SCOPOLAMINE 1 MG/3DAYS TD PT72
1.0000 | MEDICATED_PATCH | TRANSDERMAL | Status: DC
  Administered 2021-03-23: 1.5 mg via TRANSDERMAL

## 2021-03-23 MED ORDER — DEXMEDETOMIDINE (PRECEDEX) IN NS 20 MCG/5ML (4 MCG/ML) IV SYRINGE
PREFILLED_SYRINGE | INTRAVENOUS | Status: DC | PRN
  Administered 2021-03-23: 8 ug via INTRAVENOUS
  Administered 2021-03-23: 4 ug via INTRAVENOUS

## 2021-03-23 MED ORDER — LIDOCAINE HCL (PF) 2 % IJ SOLN
INTRAMUSCULAR | Status: AC
  Filled 2021-03-23: qty 5

## 2021-03-23 MED ORDER — LACTATED RINGERS IV SOLN: INTRAVENOUS | Status: DC

## 2021-03-23 MED ORDER — PROPOFOL 10 MG/ML IV BOLUS
INTRAVENOUS | Status: DC | PRN
  Administered 2021-03-23: 100 mg via INTRAVENOUS

## 2021-03-23 MED ORDER — MIDAZOLAM HCL 5 MG/5ML IJ SOLN
INTRAMUSCULAR | Status: DC | PRN
  Administered 2021-03-23: 2 mg via INTRAVENOUS

## 2021-03-23 MED ORDER — DEXAMETHASONE SODIUM PHOSPHATE 4 MG/ML IJ SOLN
INTRAMUSCULAR | Status: DC | PRN
  Administered 2021-03-23: 5 mg via INTRAVENOUS

## 2021-03-23 MED ORDER — OXYCODONE HCL 5 MG/5ML PO SOLN: 5.0000 mg | Freq: Once | ORAL | Status: DC | PRN

## 2021-03-23 MED ORDER — LABETALOL HCL 5 MG/ML IV SOLN
INTRAVENOUS | Status: AC
  Filled 2021-03-23: qty 4

## 2021-03-23 MED ORDER — MIDAZOLAM HCL 2 MG/2ML IJ SOLN
INTRAMUSCULAR | Status: AC
  Filled 2021-03-23: qty 2

## 2021-03-23 MED ORDER — ONDANSETRON HCL 4 MG/2ML IJ SOLN
INTRAMUSCULAR | Status: AC
  Filled 2021-03-23: qty 2

## 2021-03-23 MED ORDER — KETOROLAC TROMETHAMINE 30 MG/ML IJ SOLN
INTRAMUSCULAR | Status: DC | PRN
  Administered 2021-03-23: 30 mg via INTRAVENOUS

## 2021-03-23 MED ORDER — AMISULPRIDE (ANTIEMETIC) 5 MG/2ML IV SOLN
INTRAVENOUS | Status: AC
  Filled 2021-03-23: qty 4

## 2021-03-23 MED ORDER — KETOROLAC TROMETHAMINE 30 MG/ML IJ SOLN: 30.0000 mg | Freq: Once | INTRAMUSCULAR | Status: DC | PRN

## 2021-03-23 MED ORDER — KETOROLAC TROMETHAMINE 30 MG/ML IJ SOLN
INTRAMUSCULAR | Status: AC
  Filled 2021-03-23: qty 1

## 2021-03-23 MED ORDER — ONDANSETRON HCL 4 MG/2ML IJ SOLN
INTRAMUSCULAR | Status: DC | PRN
  Administered 2021-03-23: 4 mg via INTRAVENOUS

## 2021-03-23 MED ORDER — ONDANSETRON HCL 4 MG/2ML IJ SOLN
4.0000 mg | Freq: Once | INTRAMUSCULAR | Status: AC | PRN
  Administered 2021-03-23: 4 mg via INTRAVENOUS

## 2021-03-23 MED ORDER — POVIDONE-IODINE 10 % EX SWAB: 2.0000 "application " | Freq: Once | CUTANEOUS | Status: DC

## 2021-03-23 MED ORDER — DEXAMETHASONE SODIUM PHOSPHATE 10 MG/ML IJ SOLN
INTRAMUSCULAR | Status: AC
  Filled 2021-03-23: qty 1

## 2021-03-23 MED ORDER — SCOPOLAMINE 1 MG/3DAYS TD PT72
MEDICATED_PATCH | TRANSDERMAL | Status: AC
  Filled 2021-03-23: qty 1

## 2021-03-23 MED ORDER — OXYCODONE HCL 5 MG PO TABS: 5.0000 mg | ORAL_TABLET | Freq: Once | ORAL | Status: DC | PRN

## 2021-03-23 MED ORDER — CEFAZOLIN SODIUM-DEXTROSE 2-4 GM/100ML-% IV SOLN
INTRAVENOUS | Status: AC
  Filled 2021-03-23: qty 100

## 2021-03-23 MED ORDER — LABETALOL HCL 5 MG/ML IV SOLN
INTRAVENOUS | Status: DC | PRN
  Administered 2021-03-23: 10 mg via INTRAVENOUS
  Administered 2021-03-23 (×2): 5 mg via INTRAVENOUS

## 2021-03-23 MED ORDER — FENTANYL CITRATE (PF) 100 MCG/2ML IJ SOLN
INTRAMUSCULAR | Status: DC | PRN
  Administered 2021-03-23: 25 ug via INTRAVENOUS
  Administered 2021-03-23 (×2): 50 ug via INTRAVENOUS

## 2021-03-23 MED ORDER — BUPIVACAINE HCL (PF) 0.25 % IJ SOLN
INTRAMUSCULAR | Status: DC | PRN
  Administered 2021-03-23: 20 mL

## 2021-03-23 MED ORDER — HYDROMORPHONE HCL 1 MG/ML IJ SOLN: 0.2500 mg | INTRAMUSCULAR | Status: DC | PRN

## 2021-03-23 MED ORDER — PROPOFOL 10 MG/ML IV BOLUS
INTRAVENOUS | Status: AC
  Filled 2021-03-23: qty 20

## 2021-03-23 MED ORDER — DEXMEDETOMIDINE (PRECEDEX) IN NS 20 MCG/5ML (4 MCG/ML) IV SYRINGE
PREFILLED_SYRINGE | INTRAVENOUS | Status: AC
  Filled 2021-03-23: qty 5

## 2021-03-23 MED ORDER — VASOPRESSIN 20 UNIT/ML IV SOLN
INTRAVENOUS | Status: DC | PRN
  Administered 2021-03-23: 20 mL via INTRAMUSCULAR

## 2021-03-23 SURGICAL SUPPLY — 20 items
ABLATOR SURESOUND NOVASURE (ABLATOR) ×2 IMPLANT
CATH ROBINSON RED A/P 16FR (CATHETERS) ×2 IMPLANT
DECANTER SPIKE VIAL GLASS SM (MISCELLANEOUS) IMPLANT
DEVICE MYOSURE LITE (MISCELLANEOUS) IMPLANT
DEVICE MYOSURE REACH (MISCELLANEOUS) ×2 IMPLANT
GAUZE 4X4 16PLY ~~LOC~~+RFID DBL (SPONGE) ×2 IMPLANT
GLOVE SURG ENC MOIS LTX SZ7.5 (GLOVE) ×2 IMPLANT
GLOVE SURG UNDER POLY LF SZ7 (GLOVE) ×2 IMPLANT
GOWN STRL REUS W/TWL LRG LVL3 (GOWN DISPOSABLE) ×4 IMPLANT
HIBICLENS CHG 4% 4OZ (MISCELLANEOUS) ×2 IMPLANT
IV NS IRRIG 3000ML ARTHROMATIC (IV SOLUTION) ×2 IMPLANT
KIT PROCEDURE FLUENT (KITS) ×2 IMPLANT
KIT TURNOVER CYSTO (KITS) ×2 IMPLANT
NEEDLE SPNL 22GX3.5 QUINCKE BK (NEEDLE) ×2 IMPLANT
PACK VAGINAL MINOR WOMEN LF (CUSTOM PROCEDURE TRAY) ×2 IMPLANT
PAD OB MATERNITY 4.3X12.25 (PERSONAL CARE ITEMS) ×2 IMPLANT
PAD PREP 24X48 CUFFED NSTRL (MISCELLANEOUS) ×2 IMPLANT
SEAL CERVICAL OMNI LOK (ABLATOR) IMPLANT
SEAL ROD LENS SCOPE MYOSURE (ABLATOR) ×2 IMPLANT
TOWEL OR 17X26 10 PK STRL BLUE (TOWEL DISPOSABLE) ×2 IMPLANT

## 2021-03-23 NOTE — Op Note (Signed)
Ellen May, Ellen May MEDICAL RECORD NO: [id number] ACCOUNT NO: [id number] DATE OF BIRTH: January 09, 1971 FACILITY: [facility] LOCATION: [facility] PHYSICIAN: Lovenia Kim, MD  Operative Report   DATE OF PROCEDURE: 03/23/2021  PREOPERATIVE DIAGNOSIS:  Menometrorrhagia.  POSTOPERATIVE DIAGNOSES:  Menometrorrhagia plus large endometrial polyp.  PROCEDURE:  Diagnostic hysteroscopy, D and C, MyoSure resection of endometrial polyp, NovaSure endometrial ablation.  SURGEON:  Brien Few, MD  ASSISTANT:  None.  ANESTHESIA:  Local and general.  ESTIMATED BLOOD LOSS:  Less than 50 mL.  FLUID DEFICIT:  120 mL.  COMPLICATIONS:  None.  DRAINS:  None.  COUNTS:  Correct.  The patient to recovery in good condition.  SPECIMENS:  Endometrial curettings and polyp to pathology.  BRIEF OPERATIVE NOTE:  After being apprised of the risks of anesthesia, infection, bleeding, injury to surrounding organs, possible need for repair, delayed versus immediate complications including bowel and bladder injury, possible need for repair, the  patient was brought to the operating room and was administered general anesthetic without complications.  Prepped and draped in the usual sterile fashion, catheterized till the bladder was empty.  Exam under anesthesia reveals a bulky retroflexed uterus,  no adnexal masses.  Dilute Pitressin solution was placed, 3 and 9 o'clock cervicovaginal junction, 20 mL total dilute, paracervical block with 0.25% Marcaine placed in standard fashion, 20 mL total.  Cervix dilated up to a 19 Pratt dilator.   Hysteroscope placed.  Visualization reveals a large posterior wall endometrial polyp, which was removed after entry of the MyoSure device without difficulty and resected down to the base.  D and C performed using sharp curettage in a 4-quadrant method.   NovaSure device was placed and seated to a length of 6.5, a width of 4.8 and initiated after negative CO2 test for 62 seconds at 172  watts.  The device was removed, inspected and found to be intact.  Revisualization of the endometrial cavity reveals  evidence of a well-ablated endometrial cavity.  Good hemostasis noted.  Fluid deficit as noted.  The patient tolerated the procedure well and was transferred to recovery in good condition.   SHW D: 03/23/2021 8:51:48 am T: 03/23/2021 10:20:00 am  JOB: [id number]

## 2021-03-23 NOTE — Op Note (Signed)
03/23/2021  8:47 AM  PATIENT:  Ellen May  50 y.o. female  PRE-OPERATIVE DIAGNOSIS:  Menorrhagia, Possible Submucosal Fibroid  POST-OPERATIVE DIAGNOSIS:  Menorrhagia  PROCEDURE:  Procedure(s): DILATATION & CURETTAGE HYSTEROSCOPY WITH MYOSURE RESECTION OF ENDOMETRIAL POLYP HYSTEROSCOPY WITH NOVASURE  SURGEON:  Surgeon(s): Brien Few, MD  ASSISTANTS: none   ANESTHESIA:   local and general  ESTIMATED BLOOD LOSS: 50 mL   DRAINS: none   LOCAL MEDICATIONS USED:  MARCAINE    and Amount: 20 ml  SPECIMEN:  Source of Specimen:  EMC and polyp  DISPOSITION OF SPECIMEN:  PATHOLOGY  COUNTS:  YES  DICTATION #: DONE  PLAN OF CARE: DC HOME  PATIENT DISPOSITION:  PACU - hemodynamically stable.

## 2021-03-23 NOTE — Anesthesia Procedure Notes (Signed)
Procedure Name: LMA Insertion Date/Time: 03/23/2021 8:15 AM Performed by: Bufford Spikes, CRNA Pre-anesthesia Checklist: Patient identified, Emergency Drugs available, Suction available and Patient being monitored Patient Re-evaluated:Patient Re-evaluated prior to induction Oxygen Delivery Method: Circle system utilized Preoxygenation: Pre-oxygenation with 100% oxygen Induction Type: IV induction Ventilation: Mask ventilation without difficulty LMA: LMA inserted LMA Size: 4.0 Tube size: 4.5 mm Number of attempts: 1 Placement Confirmation: positive ETCO2 Tube secured with: Tape Dental Injury: Teeth and Oropharynx as per pre-operative assessment

## 2021-03-23 NOTE — Progress Notes (Signed)
Patient seen and examined. Consent witnessed and signed. No changes noted. Update completed.  BP (!) 183/98   Pulse 95   Temp 98.4 F (36.9 C) (Oral)   Resp 17   Ht '5\' 5"'$  (1.651 m)   Wt 66.4 kg   LMP 02/15/2021 (Approximate)   SpO2 100%   BMI 24.36 kg/m  CBC    Component Value Date/Time   WBC 8.9 03/21/2021 0901   RBC 5.95 (H) 03/21/2021 0901   HGB 14.8 03/21/2021 0901   HGB 8.9 (L) 12/13/2020 0807   HCT 47.5 (H) 03/21/2021 0901   HCT 33.4 (L) 12/13/2020 0807   PLT 241 03/21/2021 0901   PLT 303 12/13/2020 0807   MCV 79.8 (L) 03/21/2021 0901   MCV 65 (L) 12/13/2020 0807   MCH 24.9 (L) 03/21/2021 0901   MCHC 31.2 03/21/2021 0901   RDW 16.5 (H) 03/21/2021 0901   RDW 16.7 (H) 12/13/2020 0807   LYMPHSABS 3.0 02/11/2021 1403   LYMPHSABS 2.3 12/13/2020 0807   MONOABS 0.6 02/11/2021 1403   EOSABS 0.2 02/11/2021 1403   EOSABS 0.1 12/13/2020 0807   BASOSABS 0.1 02/11/2021 1403   BASOSABS 0.1 12/13/2020 [id number]

## 2021-03-23 NOTE — Discharge Instructions (Signed)

## 2021-03-23 NOTE — Transfer of Care (Signed)
Immediate Anesthesia Transfer of Care Note  Patient: Ellen May  Procedure(s) Performed: DILATATION & CURETTAGE/HYSTEROSCOPY WITH MYOSURE (Uterus) HYSTEROSCOPY WITH NOVASURE (Uterus)  Patient Location: PACU  Anesthesia Type:General  Level of Consciousness: awake, alert  and oriented  Airway & Oxygen Therapy: Patient Spontanous Breathing and Patient connected to nasal cannula oxygen  Post-op Assessment: Report given to RN and Post -op Vital signs reviewed and stable  Post vital signs: Reviewed and stable  Last Vitals:  Vitals Value Taken Time  BP 174/103 03/23/21 0900  Temp    Pulse 90 03/23/21 0904  Resp 17 03/23/21 0904  SpO2 96 % 03/23/21 0904  Vitals shown include unvalidated device data.  Last Pain:  Vitals:   03/23/21 0643  TempSrc: Oral  PainSc: 0-No pain      Patients Stated Pain Goal: 1 ([id number])  Complications: No notable events documented.

## 2021-03-23 NOTE — Anesthesia Postprocedure Evaluation (Signed)
Anesthesia Post Note  Patient: Ellen May  Procedure(s) Performed: DILATATION & CURETTAGE/HYSTEROSCOPY WITH MYOSURE (Uterus) HYSTEROSCOPY WITH NOVASURE (Uterus)     Patient location during evaluation: PACU Anesthesia Type: General Level of consciousness: awake and alert Pain management: pain level controlled Vital Signs Assessment: post-procedure vital signs reviewed and stable Respiratory status: spontaneous breathing, nonlabored ventilation, respiratory function stable and patient connected to nasal cannula oxygen Cardiovascular status: blood pressure returned to baseline and stable Postop Assessment: no apparent nausea or vomiting Anesthetic complications: no   No notable events documented.  Last Vitals:  Vitals:   03/23/21 0945 03/23/21 1000  BP: (!) 161/84 (!) 161/86  Pulse: 81 82  Resp: 15 15  Temp:    SpO2: 97% 97%    Last Pain:  Vitals:   03/23/21 0930  TempSrc:   PainSc: [place]

## 2021-03-24 ENCOUNTER — Encounter (HOSPITAL_BASED_OUTPATIENT_CLINIC_OR_DEPARTMENT_OTHER): Payer: Self-pay | Admitting: Obstetrics and Gynecology

## 2021-03-24 LAB — SURGICAL PATHOLOGY

## 2021-04-18 ENCOUNTER — Encounter: Payer: Self-pay | Admitting: Family Medicine

## 2021-04-18 NOTE — Progress Notes (Addendum)
BP (!) 173/93   Pulse (!) 102   Temp 98.4 F (36.9 C)   Ht 5\' 5"  (1.651 m)   Wt 148 lb (67.1 kg)   BMI 24.63 kg/m    Subjective:    Patient ID: Ellen May, female    DOB: January 24, 1971, 50 y.o.   MRN: [id number]  HPI: Ellen May is a 50 y.o. female  Chief Complaint  Patient presents with   Cough   UPPER RESPIRATORY TRACT INFECTION Worst symptom: Cough- started on Saturday Fever: no Cough: yes Shortness of breath: no Wheezing: no Chest pain: no Chest tightness: no Chest congestion: no Nasal congestion: yes Runny nose: yes Post nasal drip: no Sneezing: no Sore throat: yes Swollen glands: yes Sinus pressure: no Headache: no Face pain: no Toothache: no Ear pain: no bilateral Ear pressure: no bilateral Eyes red/itching:no Eye drainage/crusting: no  Vomiting: no Rash: no Fatigue: yes Sick contacts: no Strep contacts: no  Context: stable Recurrent sinusitis: no Relief with OTC cold/cough medications: no  Treatments attempted:  Coricindin and cough syrup   Relevant past medical, surgical, family and social history reviewed and updated as indicated. Interim medical history since our last visit reviewed. Allergies and medications reviewed and updated.  Review of Systems  Constitutional:  Positive for fever. Negative for fatigue.  HENT:  Positive for congestion and rhinorrhea. Negative for dental problem, ear pain, postnasal drip, sinus pressure, sinus pain, sneezing and sore throat.   Respiratory:  Positive for cough. Negative for shortness of breath and wheezing.   Cardiovascular:  Negative for chest pain.  Gastrointestinal:  Negative for vomiting.  Skin:  Negative for rash.  Neurological:  Negative for headaches.   Per HPI unless specifically indicated above     Objective:    BP (!) 173/93   Pulse (!) 102   Temp 98.4 F (36.9 C)   Ht 5\' 5"  (1.651 m)   Wt 148 lb (67.1 kg)   BMI 24.63 kg/m   Wt Readings from Last 3 Encounters:  04/19/21  148 lb (67.1 kg)  03/23/21 146 lb 6.4 oz (66.4 kg)  03/21/21 146 lb (66.2 kg)    Physical Exam Vitals and nursing note reviewed.  Constitutional:      General: She is not in acute distress.    Appearance: Normal appearance. She is normal weight. She is not ill-appearing, toxic-appearing or diaphoretic.  HENT:     Head: Normocephalic.     Right Ear: Tympanic membrane and external ear normal.     Left Ear: Tympanic membrane and external ear normal.     Nose: Congestion and rhinorrhea present.     Mouth/Throat:     Mouth: Mucous membranes are moist.     Pharynx: Oropharynx is clear. No oropharyngeal exudate or posterior oropharyngeal erythema.  Eyes:     General:        Right eye: No discharge.        Left eye: No discharge.     Extraocular Movements: Extraocular movements intact.     Conjunctiva/sclera: Conjunctivae normal.     Pupils: Pupils are equal, round, and reactive to light.  Cardiovascular:     Rate and Rhythm: Normal rate and regular rhythm.     Heart sounds: No murmur heard. Pulmonary:     Effort: Pulmonary effort is normal. No respiratory distress.     Breath sounds: Normal breath sounds. No wheezing or rales.  Musculoskeletal:     Cervical back: Normal range of motion and  neck supple.  Skin:    General: Skin is warm and dry.     Capillary Refill: Capillary refill takes less than 2 seconds.  Neurological:     General: No focal deficit present.     Mental Status: She is alert and oriented to person, place, and time. Mental status is at baseline.  Psychiatric:        Mood and Affect: Mood normal.        Behavior: Behavior normal.        Thought Content: Thought content normal.        Judgment: Judgment normal.    Results for orders placed or performed during the hospital encounter of 03/23/21  Glucose, capillary  Result Value Ref Range   Glucose-Capillary 196 (H) 70 - 99 mg/dL  Glucose, capillary  Result Value Ref Range   Glucose-Capillary 225 (H) 70 - 99  mg/dL  Pregnancy, urine POC  Result Value Ref Range   Preg Test, Ur NEGATIVE NEGATIVE  Surgical pathology  Result Value Ref Range   SURGICAL PATHOLOGY      SURGICAL PATHOLOGY CASE: [id number] PATIENT: [facility] Surgical Pathology Report     Clinical History: Menorrhagia, possible submucosal fibroid (crm)     FINAL MICROSCOPIC DIAGNOSIS:  A. ENDOMETRIUM, POLYPECTOMY: - Benign endometrial polyp - Nonpolypoid benign secretory endometrium - Negative for hyperplasia or malignancy      GROSS DESCRIPTION:  A.  Received in formalin labeled with the patients name and "Endometrial polyp" is a 2.5 x 2.4 x 0.8 cm aggregate of red-brown soft tissue fragments, submitted in toto in two cassette(s).  (LEF 03/23/2021)    Final Diagnosis performed by Jaquita Folds, MD.   Electronically signed 03/24/2021 Technical and / or Professional components performed at [facility], [place] [address] [place], [place] [address].  Immunohistochemistry Technical component (if applicable) was performed at [facility]. [address] [place], [place], [place] [address].   IMMUNOHISTOCHEMI STRY DISCLAIMER (if applicable): Some of these immunohistochemical stains may have been developed and the performance characteristics determine by [facility]. Some may not have been cleared or approved by the U.S. Food and Drug Administration. The FDA has determined that such clearance or approval is not necessary. This test is used for clinical purposes. It should not be regarded as investigational or for research. This laboratory is certified under the [place] (CLIA-88) as qualified to perform high complexity clinical laboratory testing.  The controls stained appropriately.       Assessment & Plan:   Problem List Items Addressed This Visit       Cardiovascular and Mediastinum   Hypertension    Other Visit Diagnoses     Viral upper respiratory tract infection    -  Primary   Cough medication given for patient. Likely viral. Follow up if symptoms do not improve.   Relevant Orders   DG Chest 2 View (Completed)   Cough       Will obtain chest xray to rule out pneumonia.    Relevant Medications   guaiFENesin-dextromethorphan (ROBITUSSIN DM) 100-10 MG/5ML syrup   benzonatate (TESSALON) 200 MG capsule   Other Relevant Orders   DG Chest 2 View (Completed)        Follow up plan: Return if symptoms worsen or fail to improve.

## 2021-04-18 NOTE — Telephone Encounter (Signed)
Your patient 

## 2021-04-19 ENCOUNTER — Ambulatory Visit: Payer: BC Managed Care – PPO | Admitting: Nurse Practitioner

## 2021-04-19 ENCOUNTER — Ambulatory Visit
Admission: RE | Admit: 2021-04-19 | Discharge: 2021-04-19 | Disposition: A | Discharge status: Home / Self Care | Payer: BC Managed Care – PPO | Attending: Nurse Practitioner | Admitting: Nurse Practitioner

## 2021-04-19 ENCOUNTER — Ambulatory Visit
Admission: RE | Admit: 2021-04-19 | Discharge: 2021-04-19 | Disposition: A | Discharge status: Home / Self Care | Payer: BC Managed Care – PPO | Source: Ambulatory Visit | Attending: Nurse Practitioner | Admitting: Nurse Practitioner

## 2021-04-19 ENCOUNTER — Telehealth: Payer: Self-pay

## 2021-04-19 ENCOUNTER — Encounter: Payer: Self-pay | Admitting: Nurse Practitioner

## 2021-04-19 ENCOUNTER — Other Ambulatory Visit: Payer: Self-pay

## 2021-04-19 VITALS — BP 173/93 | HR 102 | Temp 98.4°F | Ht 65.0 in | Wt 148.0 lb

## 2021-04-19 DIAGNOSIS — J069 Acute upper respiratory infection, unspecified: Secondary | ICD-10-CM

## 2021-04-19 DIAGNOSIS — R059 Cough, unspecified: Secondary | ICD-10-CM

## 2021-04-19 DIAGNOSIS — I10 Essential (primary) hypertension: Secondary | ICD-10-CM

## 2021-04-19 MED ORDER — BENZONATATE 200 MG PO CAPS: 200.0000 mg | ORAL_CAPSULE | Freq: Two times a day (BID) | ORAL | 0 refills | Status: DC | PRN

## 2021-04-19 MED ORDER — GUAIFENESIN-DM 100-10 MG/5ML PO SYRP: 5.0000 mL | ORAL_SOLUTION | ORAL | 0 refills | Status: DC | PRN

## 2021-04-19 NOTE — Telephone Encounter (Signed)
Patient was notified and verbalized understanding and has no further questions at this time.

## 2021-04-19 NOTE — Telephone Encounter (Signed)
Copied from [place] [id number]. Topic: General - Other >> Apr 19, 2021  9:20 AM Celene Kras wrote: Reason for CRM: Pt called stating that she was diagnosed with a contagious virus. She states that she would like to have a note excusing her from work for the rest of the week. Please advise.

## 2021-04-19 NOTE — Progress Notes (Signed)
Hi Ellen May.  Your chest xray is normal.

## 2021-04-20 MED ORDER — HYDROCOD POLST-CPM POLST ER 10-8 MG/5ML PO SUER: 5.0000 mL | Freq: Two times a day (BID) | ORAL | 0 refills | Status: DC | PRN

## 2021-05-02 ENCOUNTER — Other Ambulatory Visit: Payer: Self-pay | Admitting: Family Medicine

## 2021-05-17 ENCOUNTER — Inpatient Hospital Stay: Payer: BC Managed Care – PPO | Attending: Oncology

## 2021-05-17 ENCOUNTER — Other Ambulatory Visit: Payer: Self-pay

## 2021-05-17 DIAGNOSIS — D509 Iron deficiency anemia, unspecified: Secondary | ICD-10-CM | POA: Insufficient documentation

## 2021-05-17 LAB — RETIC PANEL
Immature Retic Fract: 9.9 % (ref 2.3–15.9)
RBC.: 5.73 MIL/uL — ABNORMAL HIGH (ref 3.87–5.11)
Retic Count, Absolute: 88.8 10*3/uL (ref 19.0–186.0)
Retic Ct Pct: 1.6 % (ref 0.4–3.1)
Reticulocyte Hemoglobin: 32.6 pg (ref >27.9)

## 2021-05-17 LAB — CBC WITH DIFFERENTIAL/PLATELET
Abs Immature Granulocytes: 0.05 10*3/uL (ref 0.00–0.07)
Basophils Absolute: 0.1 10*3/uL (ref 0.0–0.1)
Basophils Relative: 1 %
Eosinophils Absolute: 0.2 10*3/uL (ref 0.0–0.5)
Eosinophils Relative: 2 %
HCT: 46.9 % — ABNORMAL HIGH (ref 36.0–46.0)
Hemoglobin: 15.5 g/dL — ABNORMAL HIGH (ref 12.0–15.0)
Immature Granulocytes: 1 %
Lymphocytes Relative: 38 %
Lymphs Abs: 3.4 10*3/uL (ref 0.7–4.0)
MCH: 26.7 pg (ref 26.0–34.0)
MCHC: 33 g/dL (ref 30.0–36.0)
MCV: 80.7 fL (ref 80.0–100.0)
Monocytes Absolute: 0.5 10*3/uL (ref 0.1–1.0)
Monocytes Relative: 6 %
Neutro Abs: 4.6 10*3/uL (ref 1.7–7.7)
Neutrophils Relative %: 52 %
Platelets: 259 10*3/uL (ref 150–400)
RBC: 5.81 MIL/uL — ABNORMAL HIGH (ref 3.87–5.11)
RDW: 14 % (ref 11.5–15.5)
WBC: 8.8 10*3/uL (ref 4.0–10.5)
nRBC: 0 % (ref 0.0–0.2)

## 2021-05-17 LAB — FERRITIN: Ferritin: 59 ng/mL (ref 11–307)

## 2021-05-17 LAB — IRON AND TIBC
Iron: 106 ug/dL (ref 28–170)
Saturation Ratios: 28 % (ref 10.4–31.8)
TIBC: 374 ug/dL (ref 250–450)
UIBC: 268 ug/dL

## 2021-05-18 ENCOUNTER — Inpatient Hospital Stay (HOSPITAL_BASED_OUTPATIENT_CLINIC_OR_DEPARTMENT_OTHER): Payer: BC Managed Care – PPO | Admitting: Oncology

## 2021-05-18 ENCOUNTER — Encounter: Payer: Self-pay | Admitting: Oncology

## 2021-05-18 DIAGNOSIS — D751 Secondary polycythemia: Secondary | ICD-10-CM

## 2021-05-18 DIAGNOSIS — D500 Iron deficiency anemia secondary to blood loss (chronic): Secondary | ICD-10-CM | POA: Diagnosis not present

## 2021-05-18 NOTE — Progress Notes (Signed)
HEMATOLOGY-ONCOLOGY TeleHEALTH VISIT PROGRESS NOTE  I connected with [place] on 05/18/21  at  2:30 PM EDT by video enabled telemedicine visit and verified that I am speaking with the correct person using two identifiers. I discussed the limitations, risks, security and privacy concerns of performing an evaluation and management service by telemedicine and the availability of in-person appointments. The patient expressed understanding and agreed to proceed.   Other persons participating in the visit and their role in the encounter:  None  Patient's location: Home  Provider's location: office Chief Complaint: Iron deficiency anemia   INTERVAL HISTORY Ellen May is a 50 y.o. female who has above history reviewed by me today presents for follow up visit for management of IDA 01/04/2021 EGD and colonoscopy showed erosive gastropathy with no stigmata of recent bleeding. Colonoscopy showed non bleeding internal hemorrhoids.  03/23/2021, patient underwent hysteroscopy which showed large endometrial polyp.  She underwent endometrial ablation.   Endometrial polypectomy showed benign endometrial polyp. Patient has no new complaints.  She no longer craves for ice chips.  Admits not drinking much water. She denies snoring.  In the morning she does not feel very well refreshed. Review of Systems  Constitutional:  Negative for appetite change, chills, fatigue and fever.  HENT:   Negative for hearing loss and voice change.   Eyes:  Negative for eye problems.  Respiratory:  Negative for chest tightness and cough.   Cardiovascular:  Negative for chest pain.  Gastrointestinal:  Negative for abdominal distention, abdominal pain and blood in stool.  Endocrine: Negative for hot flashes.  Genitourinary:  Negative for difficulty urinating, frequency and menstrual problem.   Musculoskeletal:  Negative for arthralgias.  Skin:  Negative for itching and rash.  Neurological:  Negative for extremity  weakness.  Hematological:  Negative for adenopathy.  Psychiatric/Behavioral:  Negative for confusion.    Past Medical History:  Diagnosis Date   Anemia    COVID 2020   COVID 2021   cough, sinus trouble, body aches, low fever x 2 weeks all symptoms resolved   Hyperlipidemia    Hypertension    Situational anxiety    Type 2 dm    Past Surgical History:  Procedure Laterality Date   COLONOSCOPY WITH PROPOFOL N/A 01/04/2021   Procedure: COLONOSCOPY WITH PROPOFOL;  Surgeon: Lucilla Lame, MD;  Location: [facility] ENDOSCOPY;  Service: Endoscopy;  Laterality: N/A;   DILATATION & CURETTAGE/HYSTEROSCOPY WITH MYOSURE N/A 03/23/2021   Procedure: DILATATION & CURETTAGE/HYSTEROSCOPY WITH MYOSURE;  Surgeon: Brien Few, MD;  Location: [place];  Service: Gynecology;  Laterality: N/A;   ESOPHAGOGASTRODUODENOSCOPY (EGD) WITH PROPOFOL N/A 01/04/2021   Procedure: ESOPHAGOGASTRODUODENOSCOPY (EGD) WITH PROPOFOL;  Surgeon: Lucilla Lame, MD;  Location: [facility] ENDOSCOPY;  Service: Endoscopy;  Laterality: N/A;   HYSTEROSCOPY WITH NOVASURE N/A 03/23/2021   Procedure: HYSTEROSCOPY WITH NOVASURE;  Surgeon: Brien Few, MD;  Location: [place];  Service: Gynecology;  Laterality: N/A;    Family History  Problem Relation Age of Onset   Diabetes Mother    Hypertension Mother    Skin cancer Mother    Heart disease Father    Alcohol abuse Father    Hypertension Father    Diabetes Sister    Arthritis Sister        RA    Social History   Socioeconomic History   Marital status: Married    Spouse name: Not on file   Number of children: Not on file   Years of education:  Not on file   Highest education level: Not on file  Occupational History   Not on file  Tobacco Use   Smoking status: Never   Smokeless tobacco: Never  Vaping Use   Vaping Use: Never used  Substance and Sexual Activity   Alcohol use: No   Drug use: Never   Sexual activity: Yes  Other Topics Concern    Not on file  Social History Narrative   Not on file   Social Determinants of Health   Financial Resource Strain: Not on file  Food Insecurity: Not on file  Transportation Needs: Not on file  Physical Activity: Not on file  Stress: Not on file  Social Connections: Not on file  Intimate Partner Violence: Not on file    Current Outpatient Medications on File Prior to Visit  Medication Sig Dispense Refill   atorvastatin (LIPITOR) 20 MG tablet Take 1 tablet (20 mg total) by mouth daily. 90 tablet 3   benzonatate (TESSALON) 200 MG capsule Take 1 capsule (200 mg total) by mouth 2 (two) times daily as needed for cough. 20 capsule 0   chlorpheniramine-HYDROcodone (TUSSIONEX PENNKINETIC ER) 10-8 MG/5ML SUER Take 5 mLs by mouth every 12 (twelve) hours as needed for cough. 60 mL 0   empagliflozin (JARDIANCE) 25 MG TABS tablet Take 1 tablet (25 mg total) by mouth daily. 30 tablet 2   fluconazole (DIFLUCAN) 150 MG tablet daily as needed.     ibuprofen (ADVIL) 400 MG tablet Take 400 mg by mouth 3 (three) times daily.     insulin degludec (TRESIBA FLEXTOUCH) 200 UNIT/ML FlexTouch Pen Inject 20 Units into the skin daily.     iron dextran complex in sodium chloride 0.9 % 500 mL Inject into the vein once. At [facility]     Iron, Ferrous Sulfate, 325 (65 Fe) MG TABS Take 325 mg by mouth in the morning and at bedtime. 60 tablet 3   Lancets (ONETOUCH DELICA PLUS [id number]) MISC      lidocaine (XYLOCAINE) 5 % ointment as needed.     lisinopril (ZESTRIL) 5 MG tablet TAKE 1 TABLET BY MOUTH DAILY. 90 tablet 1   metFORMIN (GLUCOPHAGE-XR) 500 MG 24 hr tablet Take 2 tablets (1,000 mg total) by mouth daily. 120 tablet 2   nystatin-triamcinolone ointment (MYCOLOG) as needed.     ONE TOUCH ULTRA TEST test strip      Semaglutide 0.25 or 0.5 MG/DOSE SOPN Inject 0.5 mg into the skin once a week. Friday     No current facility-administered medications on file prior to visit.    Allergies  Allergen  Reactions   Actos [Pioglitazone] Other (See Comments)    Weight gain       Observations/Objective: Today's Vitals   05/18/21 1307  PainSc: 0-No pain   There is no height or weight on file to calculate BMI.  Physical Exam Neurological:     Mental Status: She is alert.    CBC    Component Value Date/Time   WBC 8.8 05/17/2021 1445   RBC 5.73 (H) 05/17/2021 1445   RBC 5.81 (H) 05/17/2021 1445   HGB 15.5 (H) 05/17/2021 1445   HGB 8.9 (L) 12/13/2020 0807   HCT 46.9 (H) 05/17/2021 1445   HCT 33.4 (L) 12/13/2020 0807   PLT 259 05/17/2021 1445   PLT 303 12/13/2020 0807   MCV 80.7 05/17/2021 1445   MCV 65 (L) 12/13/2020 0807   MCH 26.7 05/17/2021 1445   MCHC 33.0 05/17/2021 1445  RDW 14.0 05/17/2021 1445   RDW 16.7 (H) 12/13/2020 0807   LYMPHSABS 3.4 05/17/2021 1445   LYMPHSABS 2.3 12/13/2020 0807   MONOABS 0.5 05/17/2021 1445   EOSABS 0.2 05/17/2021 1445   EOSABS 0.1 12/13/2020 0807   BASOSABS 0.1 05/17/2021 1445   BASOSABS 0.1 12/13/2020 0807    CMP     Component Value Date/Time   NA 137 03/21/2021 0901   NA 136 (A) 12/03/2020 0000   K 4.6 03/21/2021 0901   CL 107 03/21/2021 0901   CO2 23 03/21/2021 0901   GLUCOSE 192 (H) 03/21/2021 0901   BUN 15 03/21/2021 0901   BUN 8 12/03/2020 0000   CREATININE 0.46 03/21/2021 0901   CALCIUM 9.6 03/21/2021 0901   PROT 7.1 12/17/2020 1149   PROT 6.3 03/19/2017 0952   ALBUMIN 4.0 12/17/2020 1149   ALBUMIN 4.0 03/19/2017 0952   AST 21 12/17/2020 1149   ALT 27 12/17/2020 1149   ALKPHOS 70 12/17/2020 1149   BILITOT 0.6 12/17/2020 1149   BILITOT 0.3 03/19/2017 0952   GFRNONAA >60 03/21/2021 0901   GFRAA 138 03/19/2017 0952     Assessment and Plan: 1. Iron deficiency anemia due to chronic blood loss   2. Erythrocytosis     Labs reviewed and discussed with patient. Iron panel has completely normalized.  No anemia.  I will hold off additional IV iron treatments.  Erythrocytosis, hemoglobin 15.5.  Etiology unknown.   Could be due to dehydration.  Encourage patient to increase oral hydration. Pete CBC in 3 months.  If persistently high, patient may have primary or secondary erythrocytosis warranting further work-up.  She agrees with the plan. .  Follow Up Instructions: 3 months.    I discussed the assessment and treatment plan with the patient. The patient was provided an opportunity to ask questions and all were answered. The patient agreed with the plan and demonstrated an understanding of the instructions.  The patient was advised to call back or seek an in-person evaluation if the symptoms worsen or if the condition fails to improve as anticipated.   Earlie Server, MD 05/18/2021 8:39 PM

## 2021-05-18 NOTE — Progress Notes (Signed)
Pt confirmed availability for virtual visit as scheduled. Pt reports feeling as if she has gained 5 lbs since August. Pt has questions regarding lab results. No other concerns at this time

## 2021-06-13 ENCOUNTER — Encounter: Payer: Self-pay | Admitting: Family Medicine

## 2021-06-13 ENCOUNTER — Telehealth: Payer: Self-pay | Admitting: *Deleted

## 2021-06-13 NOTE — Telephone Encounter (Signed)
Opened in error

## 2021-06-29 ENCOUNTER — Telehealth: Payer: Self-pay | Admitting: Oncology

## 2021-06-29 NOTE — Telephone Encounter (Signed)
Pt called in to question why Dr. Tasia Catchings requested a sleep study.Please give her a call back at [phone]

## 2021-06-29 NOTE — Telephone Encounter (Signed)
Dr. Tasia Catchings what would be the reason for sleep study. I don't see it in last note.

## 2021-06-29 NOTE — Telephone Encounter (Signed)
The reason for sleep study erythrocytosis. Mychart message sent to pt.

## 2021-07-06 ENCOUNTER — Ambulatory Visit: Payer: BC Managed Care – PPO | Admitting: Nurse Practitioner

## 2021-07-06 ENCOUNTER — Other Ambulatory Visit: Payer: Self-pay

## 2021-07-06 ENCOUNTER — Encounter: Payer: Self-pay | Admitting: Nurse Practitioner

## 2021-07-06 VITALS — BP 130/86 | HR 106 | Temp 97.9°F | Wt 152.8 lb

## 2021-07-06 DIAGNOSIS — T753XXA Motion sickness, initial encounter: Secondary | ICD-10-CM

## 2021-07-06 DIAGNOSIS — I10 Essential (primary) hypertension: Secondary | ICD-10-CM | POA: Diagnosis not present

## 2021-07-06 MED ORDER — SCOPOLAMINE 1 MG/3DAYS TD PT72: 1.0000 | MEDICATED_PATCH | TRANSDERMAL | 1 refills | Status: DC

## 2021-07-06 MED ORDER — ALPRAZOLAM 0.25 MG PO TABS: 0.2500 mg | ORAL_TABLET | Freq: Two times a day (BID) | ORAL | 0 refills | Status: DC | PRN

## 2021-07-06 NOTE — Assessment & Plan Note (Addendum)
Chronic. Elevated at visit today.  Controlled at home.  Follow up in 6 months for reevaluation.  

## 2021-07-06 NOTE — Progress Notes (Signed)
BP 130/86 Comment: home blood pressure   Pulse (!) 106    Temp 97.9 F (36.6 C) (Oral)    Wt 152 lb 12.8 oz (69.3 kg)    SpO2 97%    BMI 25.43 kg/m    Subjective:    Patient ID: Ellen May, female    DOB: Mar 30, 1971, 50 y.o.   MRN: [id number]  HPI: Ellen May is a 50 y.o. female  Chief Complaint  Patient presents with   Motion Sickness     Pt states she would like to discuss getting motion sickness patches for a upcoming trip     Patient presents to clinic today to discuss getting motion sickness patches for her upcoming trip.  Patient denies other concerns at visit today.   HYPERTENSION Hypertension status: uncontrolled  Satisfied with current treatment? no Duration of hypertension: years BP monitoring frequency:   occasionally BP range: 130/80 BP medication side effects:  no Medication compliance: excellent compliance Previous BP meds:lisinopril Aspirin: no Recurrent headaches: no Visual changes: no Palpitations: no Dyspnea: no Chest pain: no Lower extremity edema: no Dizzy/lightheaded: no     Relevant past medical, surgical, family and social history reviewed and updated as indicated. Interim medical history since our last visit reviewed. Allergies and medications reviewed and updated.  Review of Systems  Eyes:  Negative for visual disturbance.  Respiratory:  Negative for cough, chest tightness and shortness of breath.   Cardiovascular:  Negative for chest pain, palpitations and leg swelling.  Neurological:  Negative for dizziness and headaches.   Per HPI unless specifically indicated above     Objective:    BP 130/86 Comment: home blood pressure   Pulse (!) 106    Temp 97.9 F (36.6 C) (Oral)    Wt 152 lb 12.8 oz (69.3 kg)    SpO2 97%    BMI 25.43 kg/m   Wt Readings from Last 3 Encounters:  07/06/21 152 lb 12.8 oz (69.3 kg)  04/19/21 148 lb (67.1 kg)  03/23/21 146 lb 6.4 oz (66.4 kg)    Physical Exam Vitals and nursing note reviewed.   Constitutional:      General: She is not in acute distress.    Appearance: Normal appearance. She is normal weight. She is not ill-appearing, toxic-appearing or diaphoretic.  HENT:     Head: Normocephalic.     Right Ear: External ear normal.     Left Ear: External ear normal.     Nose: Nose normal.     Mouth/Throat:     Mouth: Mucous membranes are moist.     Pharynx: Oropharynx is clear.  Eyes:     General:        Right eye: No discharge.        Left eye: No discharge.     Extraocular Movements: Extraocular movements intact.     Conjunctiva/sclera: Conjunctivae normal.     Pupils: Pupils are equal, round, and reactive to light.  Cardiovascular:     Rate and Rhythm: Normal rate and regular rhythm.     Heart sounds: No murmur heard. Pulmonary:     Effort: Pulmonary effort is normal. No respiratory distress.     Breath sounds: Normal breath sounds. No wheezing or rales.  Musculoskeletal:     Cervical back: Normal range of motion and neck supple.  Skin:    General: Skin is warm and dry.     Capillary Refill: Capillary refill takes less than 2 seconds.  Neurological:  General: No focal deficit present.     Mental Status: She is alert and oriented to person, place, and time. Mental status is at baseline.  Psychiatric:        Mood and Affect: Mood normal.        Behavior: Behavior normal.        Thought Content: Thought content normal.        Judgment: Judgment normal.    Results for orders placed or performed in visit on 05/17/21  Retic Panel  Result Value Ref Range   Retic Ct Pct 1.6 0.4 - 3.1 %   RBC. 5.73 (H) 3.87 - 5.11 MIL/uL   Retic Count, Absolute 88.8 19.0 - 186.0 K/uL   Immature Retic Fract 9.9 2.3 - 15.9 %   Reticulocyte Hemoglobin 32.6 >27.9 pg  CBC with Differential/Platelet  Result Value Ref Range   WBC 8.8 4.0 - 10.5 K/uL   RBC 5.81 (H) 3.87 - 5.11 MIL/uL   Hemoglobin 15.5 (H) 12.0 - 15.0 g/dL   HCT 46.9 (H) 36.0 - 46.0 %   MCV 80.7 80.0 - 100.0 fL    MCH 26.7 26.0 - 34.0 pg   MCHC 33.0 30.0 - 36.0 g/dL   RDW 14.0 11.5 - 15.5 %   Platelets 259 150 - 400 K/uL   nRBC 0.0 0.0 - 0.2 %   Neutrophils Relative % 52 %   Neutro Abs 4.6 1.7 - 7.7 K/uL   Lymphocytes Relative 38 %   Lymphs Abs 3.4 0.7 - 4.0 K/uL   Monocytes Relative 6 %   Monocytes Absolute 0.5 0.1 - 1.0 K/uL   Eosinophils Relative 2 %   Eosinophils Absolute 0.2 0.0 - 0.5 K/uL   Basophils Relative 1 %   Basophils Absolute 0.1 0.0 - 0.1 K/uL   Immature Granulocytes 1 %   Abs Immature Granulocytes 0.05 0.00 - 0.07 K/uL  Iron and TIBC  Result Value Ref Range   Iron 106 28 - 170 ug/dL   TIBC 374 250 - 450 ug/dL   Saturation Ratios 28 10.4 - 31.8 %   UIBC 268 ug/dL  Ferritin  Result Value Ref Range   Ferritin 59 11 - 307 ng/mL      Assessment & Plan:   Problem List Items Addressed This Visit       Cardiovascular and Mediastinum   Hypertension - Primary    Chronic. Elevated at visit today.  Controlled at home.  Follow up in 6 months for reevaluation.       Other Visit Diagnoses     Motion sickness, initial encounter       Will give Scopolamine patches for motion sickness. Will give small dose of Xanax to help patient while on the plane. FU if symptoms do not improve.         Follow up plan: Return if symptoms worsen or fail to improve.

## 2021-08-15 ENCOUNTER — Inpatient Hospital Stay: Payer: BC Managed Care – PPO | Attending: Oncology

## 2021-08-15 ENCOUNTER — Other Ambulatory Visit: Payer: Self-pay

## 2021-08-15 DIAGNOSIS — D509 Iron deficiency anemia, unspecified: Secondary | ICD-10-CM | POA: Diagnosis present

## 2021-08-15 DIAGNOSIS — Z8616 Personal history of COVID-19: Secondary | ICD-10-CM | POA: Diagnosis not present

## 2021-08-15 DIAGNOSIS — Z79899 Other long term (current) drug therapy: Secondary | ICD-10-CM | POA: Diagnosis not present

## 2021-08-15 DIAGNOSIS — Z7984 Long term (current) use of oral hypoglycemic drugs: Secondary | ICD-10-CM | POA: Insufficient documentation

## 2021-08-15 DIAGNOSIS — Z794 Long term (current) use of insulin: Secondary | ICD-10-CM | POA: Insufficient documentation

## 2021-08-15 DIAGNOSIS — D751 Secondary polycythemia: Secondary | ICD-10-CM | POA: Insufficient documentation

## 2021-08-15 DIAGNOSIS — D500 Iron deficiency anemia secondary to blood loss (chronic): Secondary | ICD-10-CM

## 2021-08-15 LAB — CBC WITH DIFFERENTIAL/PLATELET
Abs Immature Granulocytes: 0.06 10*3/uL (ref 0.00–0.07)
Basophils Absolute: 0.1 10*3/uL (ref 0.0–0.1)
Basophils Relative: 1 %
Eosinophils Absolute: 0.1 10*3/uL (ref 0.0–0.5)
Eosinophils Relative: 1 %
HCT: 47.4 % — ABNORMAL HIGH (ref 36.0–46.0)
Hemoglobin: 15.9 g/dL — ABNORMAL HIGH (ref 12.0–15.0)
Immature Granulocytes: 1 %
Lymphocytes Relative: 31 %
Lymphs Abs: 2.7 10*3/uL (ref 0.7–4.0)
MCH: 27.6 pg (ref 26.0–34.0)
MCHC: 33.5 g/dL (ref 30.0–36.0)
MCV: 82.3 fL (ref 80.0–100.0)
Monocytes Absolute: 0.5 10*3/uL (ref 0.1–1.0)
Monocytes Relative: 6 %
Neutro Abs: 5.2 10*3/uL (ref 1.7–7.7)
Neutrophils Relative %: 60 %
Platelets: 282 10*3/uL (ref 150–400)
RBC: 5.76 MIL/uL — ABNORMAL HIGH (ref 3.87–5.11)
RDW: 12.6 % (ref 11.5–15.5)
WBC: 8.7 10*3/uL (ref 4.0–10.5)
nRBC: 0 % (ref 0.0–0.2)

## 2021-08-18 ENCOUNTER — Inpatient Hospital Stay (HOSPITAL_BASED_OUTPATIENT_CLINIC_OR_DEPARTMENT_OTHER): Payer: BC Managed Care – PPO | Admitting: Oncology

## 2021-08-18 ENCOUNTER — Encounter: Payer: Self-pay | Admitting: Nurse Practitioner

## 2021-08-18 ENCOUNTER — Encounter: Payer: Self-pay | Admitting: Oncology

## 2021-08-18 DIAGNOSIS — R0683 Snoring: Secondary | ICD-10-CM

## 2021-08-18 DIAGNOSIS — D751 Secondary polycythemia: Secondary | ICD-10-CM

## 2021-08-18 DIAGNOSIS — D582 Other hemoglobinopathies: Secondary | ICD-10-CM

## 2021-08-18 DIAGNOSIS — R7989 Other specified abnormal findings of blood chemistry: Secondary | ICD-10-CM

## 2021-08-18 LAB — HM DIABETES EYE EXAM

## 2021-08-18 NOTE — Progress Notes (Signed)
HEMATOLOGY-ONCOLOGY TeleHEALTH VISIT PROGRESS NOTE  I connected with Ellen May on 08/18/21  at  2:45 PM EST by video enabled telemedicine visit and verified that I am speaking with the correct person using two identifiers. I discussed the limitations, risks, security and privacy concerns of performing an evaluation and management service by telemedicine and the availability of in-person appointments. The patient expressed understanding and agreed to proceed.   Other persons participating in the visit and their role in the encounter:  None  Patient's location: Home  Provider's location: office Chief Complaint: Iron deficiency anemia  PERTINENT HEMATOLOGY HISTORY  01/04/2021 EGD and colonoscopy showed erosive gastropathy with no stigmata of recent bleeding. Colonoscopy showed non bleeding internal hemorrhoids.  03/23/2021, patient underwent hysteroscopy which showed large endometrial polyp.  She underwent endometrial ablation.   Endometrial polypectomy showed benign endometrial polyp.   INTERVAL HISTORY Ellen May is a 51 y.o. female who has above history reviewed by me today presents for follow up visit for history of IDA and erythrocytosis. Menorrhagia has improved.  Energy level is good.  Review of Systems  Constitutional:  Negative for appetite change, chills, fatigue and fever.  HENT:   Negative for hearing loss and voice change.   Eyes:  Negative for eye problems.  Respiratory:  Negative for chest tightness and cough.   Cardiovascular:  Negative for chest pain.  Gastrointestinal:  Negative for abdominal distention, abdominal pain and blood in stool.  Endocrine: Negative for hot flashes.  Genitourinary:  Negative for difficulty urinating, frequency and menstrual problem.   Musculoskeletal:  Negative for arthralgias.  Skin:  Negative for itching and rash.  Neurological:  Negative for extremity weakness.  Hematological:  Negative for adenopathy.  Psychiatric/Behavioral:   Negative for confusion.    Past Medical History:  Diagnosis Date   Anemia    COVID 2020   COVID 2021   cough, sinus trouble, body aches, low fever x 2 weeks all symptoms resolved   Hyperlipidemia    Hypertension    Situational anxiety    Type 2 dm    Past Surgical History:  Procedure Laterality Date   COLONOSCOPY WITH PROPOFOL N/A 01/04/2021   Procedure: COLONOSCOPY WITH PROPOFOL;  Surgeon: Lucilla Lame, MD;  Location: [facility] ENDOSCOPY;  Service: Endoscopy;  Laterality: N/A;   DILATATION & CURETTAGE/HYSTEROSCOPY WITH MYOSURE N/A 03/23/2021   Procedure: DILATATION & CURETTAGE/HYSTEROSCOPY WITH MYOSURE;  Surgeon: Brien Few, MD;  Location: [place];  Service: Gynecology;  Laterality: N/A;   ESOPHAGOGASTRODUODENOSCOPY (EGD) WITH PROPOFOL N/A 01/04/2021   Procedure: ESOPHAGOGASTRODUODENOSCOPY (EGD) WITH PROPOFOL;  Surgeon: Lucilla Lame, MD;  Location: [facility] ENDOSCOPY;  Service: Endoscopy;  Laterality: N/A;   HYSTEROSCOPY WITH NOVASURE N/A 03/23/2021   Procedure: HYSTEROSCOPY WITH NOVASURE;  Surgeon: Brien Few, MD;  Location: [place];  Service: Gynecology;  Laterality: N/A;    Family History  Problem Relation Age of Onset   Diabetes Mother    Hypertension Mother    Skin cancer Mother    Heart disease Father    Alcohol abuse Father    Hypertension Father    Diabetes Sister    Arthritis Sister        RA    Social History   Socioeconomic History   Marital status: Married    Spouse name: Not on file   Number of children: Not on file   Years of education: Not on file   Highest education level: Not on file  Occupational History   Not  on file  Tobacco Use   Smoking status: Never   Smokeless tobacco: Never  Vaping Use   Vaping Use: Never used  Substance and Sexual Activity   Alcohol use: No   Drug use: Never   Sexual activity: Yes  Other Topics Concern   Not on file  Social History Narrative   Not on file   Social Determinants of  Health   Financial Resource Strain: Not on file  Food Insecurity: Not on file  Transportation Needs: Not on file  Physical Activity: Not on file  Stress: Not on file  Social Connections: Not on file  Intimate Partner Violence: Not on file    Current Outpatient Medications on File Prior to Visit  Medication Sig Dispense Refill   atorvastatin (LIPITOR) 20 MG tablet Take 1 tablet (20 mg total) by mouth daily. 90 tablet 3   empagliflozin (JARDIANCE) 25 MG TABS tablet Take 1 tablet (25 mg total) by mouth daily. 30 tablet 2   fluconazole (DIFLUCAN) 150 MG tablet daily as needed.     ibuprofen (ADVIL) 400 MG tablet Take 400 mg by mouth 3 (three) times daily.     insulin degludec (TRESIBA FLEXTOUCH) 200 UNIT/ML FlexTouch Pen Inject 20 Units into the skin daily.     Lancets (ONETOUCH DELICA PLUS [id number]) MISC      lidocaine (XYLOCAINE) 5 % ointment as needed.     lisinopril (ZESTRIL) 5 MG tablet TAKE 1 TABLET BY MOUTH DAILY. 90 tablet 1   metFORMIN (GLUCOPHAGE-XR) 500 MG 24 hr tablet Take 2 tablets (1,000 mg total) by mouth daily. 120 tablet 2   nystatin-triamcinolone ointment (MYCOLOG) as needed.     ONE TOUCH ULTRA TEST test strip      scopolamine (TRANSDERM-SCOP) 1 MG/3DAYS Place 1 patch (1.5 mg total) onto the skin every 3 (three) days. 10 patch 1   Semaglutide 0.25 or 0.5 MG/DOSE SOPN Inject 0.5 mg into the skin once a week. Friday     ALPRAZolam (XANAX) 0.25 MG tablet Take 1 tablet (0.25 mg total) by mouth 2 (two) times daily as needed for anxiety. (Patient not taking: Reported on 08/18/2021) 8 tablet 0   No current facility-administered medications on file prior to visit.    Allergies  Allergen Reactions   Actos [Pioglitazone] Other (See Comments)    Weight gain       Observations/Objective: Today's Vitals   08/18/21 1434  PainSc: 0-No pain   There is no height or weight on file to calculate BMI.  Physical Exam Neurological:     Mental Status: She is alert.    CBC     Component Value Date/Time   WBC 8.7 08/15/2021 1441   RBC 5.76 (H) 08/15/2021 1441   HGB 15.9 (H) 08/15/2021 1441   HGB 8.9 (L) 12/13/2020 0807   HCT 47.4 (H) 08/15/2021 1441   HCT 33.4 (L) 12/13/2020 0807   PLT 282 08/15/2021 1441   PLT 303 12/13/2020 0807   MCV 82.3 08/15/2021 1441   MCV 65 (L) 12/13/2020 0807   MCH 27.6 08/15/2021 1441   MCHC 33.5 08/15/2021 1441   RDW 12.6 08/15/2021 1441   RDW 16.7 (H) 12/13/2020 0807   LYMPHSABS 2.7 08/15/2021 1441   LYMPHSABS 2.3 12/13/2020 0807   MONOABS 0.5 08/15/2021 1441   EOSABS 0.1 08/15/2021 1441   EOSABS 0.1 12/13/2020 0807   BASOSABS 0.1 08/15/2021 1441   BASOSABS 0.1 12/13/2020 0807    CMP     Component Value Date/Time  NA 137 03/21/2021 0901   NA 136 (A) 12/03/2020 0000   K 4.6 03/21/2021 0901   CL 107 03/21/2021 0901   CO2 23 03/21/2021 0901   GLUCOSE 192 (H) 03/21/2021 0901   BUN 15 03/21/2021 0901   BUN 8 12/03/2020 0000   CREATININE 0.46 03/21/2021 0901   CALCIUM 9.6 03/21/2021 0901   PROT 7.1 12/17/2020 1149   PROT 6.3 03/19/2017 0952   ALBUMIN 4.0 12/17/2020 1149   ALBUMIN 4.0 03/19/2017 0952   AST 21 12/17/2020 1149   ALT 27 12/17/2020 1149   ALKPHOS 70 12/17/2020 1149   BILITOT 0.6 12/17/2020 1149   BILITOT 0.3 03/19/2017 0952   GFRNONAA >60 03/21/2021 0901   GFRAA 138 03/19/2017 0952     Assessment and Plan: 1. Erythrocytosis     Erythrocytosis, hemoglobin 15.9 Primary versus secondary. I will check BCR ABL 1 FISH, JAK2 mutation with reflex, erythropoietin level, carbon monoxide level. Recommend patient to discuss with primary care provider for sleep apnea study. No need for IV Venofer treatments at this point.  Follow Up Instructions: 6 months   I discussed the assessment and treatment plan with the patient. The patient was provided an opportunity to ask questions and all were answered. The patient agreed with the plan and demonstrated an understanding of the instructions.  The patient was  advised to call back or seek an in-person evaluation if the symptoms worsen or if the condition fails to improve as anticipated.   Earlie Server, MD 08/18/2021 11:17 PM

## 2021-11-04 ENCOUNTER — Ambulatory Visit: Payer: BC Managed Care – PPO | Attending: Neurology

## 2021-11-04 DIAGNOSIS — R0683 Snoring: Secondary | ICD-10-CM | POA: Diagnosis not present

## 2021-11-24 ENCOUNTER — Encounter: Payer: BC Managed Care – PPO | Admitting: Dermatology

## 2021-12-06 ENCOUNTER — Encounter: Payer: Self-pay | Admitting: Nurse Practitioner

## 2021-12-06 ENCOUNTER — Ambulatory Visit: Payer: BC Managed Care – PPO | Admitting: Nurse Practitioner

## 2021-12-06 VITALS — BP 119/78 | HR 97 | Temp 98.1°F | Ht 65.0 in | Wt 149.0 lb

## 2021-12-06 DIAGNOSIS — R399 Unspecified symptoms and signs involving the genitourinary system: Secondary | ICD-10-CM | POA: Diagnosis not present

## 2021-12-06 LAB — MICROSCOPIC EXAMINATION
Bacteria, UA: NONE SEEN
RBC, Urine: >30 /hpf — ABNORMAL HIGH (ref 0–2)
WBC, UA: NONE SEEN /hpf (ref 0–5)

## 2021-12-06 LAB — WET PREP FOR TRICH, YEAST, CLUE
Clue Cell Exam: NEGATIVE
Trichomonas Exam: NEGATIVE
Yeast Exam: POSITIVE — AB

## 2021-12-06 LAB — URINALYSIS, ROUTINE W REFLEX MICROSCOPIC
Bilirubin, UA: NEGATIVE
Ketones, UA: NEGATIVE
Leukocytes,UA: NEGATIVE
Nitrite, UA: NEGATIVE
Specific Gravity, UA: 1.025 (ref 1.005–1.030)
Urobilinogen, Ur: 0.2 mg/dL (ref 0.2–1.0)
pH, UA: 5 (ref 5.0–7.5)

## 2021-12-06 MED ORDER — FLUCONAZOLE 150 MG PO TABS: 150.0000 mg | ORAL_TABLET | Freq: Once | ORAL | 0 refills | Status: AC

## 2021-12-06 NOTE — Patient Instructions (Signed)
Urinary Tract Infection, Adult A urinary tract infection (UTI) is an infection of any part of the urinary tract. The urinary tract includes: The kidneys. The ureters. The bladder. The urethra. These organs make, store, and get rid of pee (urine) in the body. What are the causes? This infection is caused by germs (bacteria) in your genital area. These germs grow and cause swelling (inflammation) of your urinary tract. What increases the risk? The following factors may make you more likely to develop this condition: Using a small, thin tube (catheter) to drain pee. Not being able to control when you pee or poop (incontinence). Being female. If you are female, these things can increase the risk: Using these methods to prevent pregnancy: A medicine that kills sperm (spermicide). A device that blocks sperm (diaphragm). Having low levels of a female hormone (estrogen). Being pregnant. You are more likely to develop this condition if: You have genes that add to your risk. You are sexually active. You take antibiotic medicines. You have trouble peeing because of: A prostate that is bigger than normal, if you are female. A blockage in the part of your body that drains pee from the bladder. A kidney stone. A nerve condition that affects your bladder. Not getting enough to drink. Not peeing often enough. You have other conditions, such as: Diabetes. A weak disease-fighting system (immune system). Sickle cell disease. Gout. Injury of the spine. What are the signs or symptoms? Symptoms of this condition include: Needing to pee right away. Peeing small amounts often. Pain or burning when peeing. Blood in the pee. Pee that smells bad or not like normal. Trouble peeing. Pee that is cloudy. Fluid coming from the vagina, if you are female. Pain in the belly or lower back. Other symptoms include: Vomiting. Not feeling hungry. Feeling mixed up (confused). This may be the first symptom in  older adults. Being tired and grouchy (irritable). A fever. Watery poop (diarrhea). How is this treated? Taking antibiotic medicine. Taking other medicines. Drinking enough water. In some cases, you may need to see a specialist. Follow these instructions at home:  Medicines Take over-the-counter and prescription medicines only as told by your doctor. If you were prescribed an antibiotic medicine, take it as told by your doctor. Do not stop taking it even if you start to feel better. General instructions Make sure you: Pee until your bladder is empty. Do not hold pee for a long time. Empty your bladder after sex. Wipe from front to back after peeing or pooping if you are a female. Use each tissue one time when you wipe. Drink enough fluid to keep your pee pale yellow. Keep all follow-up visits. Contact a doctor if: You do not get better after 1-2 days. Your symptoms go away and then come back. Get help right away if: You have very bad back pain. You have very bad pain in your lower belly. You have a fever. You have chills. You feeling like you will vomit or you vomit. Summary A urinary tract infection (UTI) is an infection of any part of the urinary tract. This condition is caused by germs in your genital area. There are many risk factors for a UTI. Treatment includes antibiotic medicines. Drink enough fluid to keep your pee pale yellow. This information is not intended to replace advice given to you by your health care provider. Make sure you discuss any questions you have with your health care provider. Document Revised: 02/20/2020 Document Reviewed: 02/20/2020 Elsevier Patient Education    2023 [employer]  

## 2021-12-06 NOTE — Assessment & Plan Note (Signed)
Acute for one week with some improvement with home treatment.  UA today noting 3+ BLD with cycle, 3+ glucose -- no nitrites or bacteria.  Wet prep + yeast.  Will send in Diflucan to take x one dose and recommend continue hydration at home.  Send urine for culture and treat as needed.   ?

## 2021-12-06 NOTE — Progress Notes (Signed)
? ?BP 119/78   Pulse 97   Temp 98.1 ?F (36.7 ?C) (Oral)   Ht '5\' 5"'$  (1.651 m)   Wt 149 lb (67.6 kg)   SpO2 98%   BMI 24.79 kg/m?   ? ?Subjective:  ? ? Patient ID: Ellen May, female    DOB: [birth year], 51 y.o.   MRN: [id number] ? ?HPI: ?Ellen May is a 51 y.o. female ? ?Chief Complaint  ?Patient presents with  ? Urinary Urgency  ? Dysuria  ?  Patient states her symptoms started about a week ago and she has tried over the counter medication similar to [place]. Patient says she does not have any relief and patient states she stopped taking medication a couple of days ago.   ? ?URINARY SYMPTOMS ?Started about one week ago, was taking OTC medications, but still there minimally. ?Dysuria: burning ?Urinary frequency: yes ?Urgency: yes ?Small volume voids: yes ?Symptom severity: yes ?Urinary incontinence: no ?Foul odor: no ?Hematuria:  spotting from cycle ?Abdominal pain: no ?Back pain: no ?Suprapubic pain/pressure: yes ?Flank pain: no ?Fever:  no ?Vomiting: no ?Relief with pyridium: yes ?Status: better but not 100% ?Previous urinary tract infection: yes ?Recurrent urinary tract infection: no ?Sexual activity: monogamous ?Treatments attempted: pyridium and increasing fluids   ? ?Relevant past medical, surgical, family and social history reviewed and updated as indicated. Interim medical history since our last visit reviewed. ?Allergies and medications reviewed and updated. ? ?Review of Systems  ?Constitutional:  Negative for activity change, appetite change, diaphoresis, fatigue and fever.  ?Respiratory:  Negative for cough, chest tightness and shortness of breath.   ?Cardiovascular:  Negative for chest pain, palpitations and leg swelling.  ?Gastrointestinal: Negative.   ?Genitourinary:  Positive for dysuria, frequency, urgency and vaginal bleeding (with cycle). Negative for vaginal discharge and vaginal pain.  ?Neurological: Negative.   ?Psychiatric/Behavioral: Negative.    ? ?Per HPI unless specifically  indicated above ? ?   ?Objective:  ?  ?BP 119/78   Pulse 97   Temp 98.1 ?F (36.7 ?C) (Oral)   Ht '5\' 5"'$  (1.651 m)   Wt 149 lb (67.6 kg)   SpO2 98%   BMI 24.79 kg/m?   ?Wt Readings from Last 3 Encounters:  ?12/06/21 149 lb (67.6 kg)  ?07/06/21 152 lb 12.8 oz (69.3 kg)  ?04/19/21 148 lb (67.1 kg)  ?  ?Physical Exam ?Vitals and nursing note reviewed.  ?Constitutional:   ?   General: She is awake. She is not in acute distress. ?   Appearance: She is well-developed and well-groomed. She is not ill-appearing or toxic-appearing.  ?HENT:  ?   Head: Normocephalic.  ?   Right Ear: Hearing normal.  ?   Left Ear: Hearing normal.  ?Eyes:  ?   General: Lids are normal.     ?   Right eye: No discharge.     ?   Left eye: No discharge.  ?   Conjunctiva/sclera: Conjunctivae normal.  ?   Pupils: Pupils are equal, round, and reactive to light.  ?Neck:  ?   Vascular: No carotid bruit.  ?Cardiovascular:  ?   Rate and Rhythm: Normal rate and regular rhythm.  ?   Heart sounds: Normal heart sounds. No murmur heard. ?  No gallop.  ?Pulmonary:  ?   Effort: Pulmonary effort is normal. No accessory muscle usage or respiratory distress.  ?   Breath sounds: Normal breath sounds.  ?Abdominal:  ?   General: Bowel sounds are normal. There  is no distension.  ?   Palpations: Abdomen is soft.  ?   Tenderness: There is no abdominal tenderness. There is no right CVA tenderness or left CVA tenderness.  ?Musculoskeletal:  ?   Cervical back: Normal range of motion and neck supple.  ?   Right lower leg: No edema.  ?   Left lower leg: No edema.  ?Skin: ?   General: Skin is warm and dry.  ?Neurological:  ?   Mental Status: She is alert and oriented to person, place, and time.  ?Psychiatric:     ?   Attention and Perception: Attention normal.     ?   Mood and Affect: Mood normal.     ?   Speech: Speech normal.     ?   Behavior: Behavior normal. Behavior is cooperative.     ?   Thought Content: Thought content normal.  ? ? ?Results for orders placed or  performed in visit on 08/15/21  ?CBC with Differential/Platelet  ?Result Value Ref Range  ? WBC 8.7 4.0 - 10.5 K/uL  ? RBC 5.76 (H) 3.87 - 5.11 MIL/uL  ? Hemoglobin 15.9 (H) 12.0 - 15.0 g/dL  ? HCT 47.4 (H) 36.0 - 46.0 %  ? MCV 82.3 80.0 - 100.0 fL  ? MCH 27.6 26.0 - 34.0 pg  ? MCHC 33.5 30.0 - 36.0 g/dL  ? RDW 12.6 11.5 - 15.5 %  ? Platelets 282 150 - 400 K/uL  ? nRBC 0.0 0.0 - 0.2 %  ? Neutrophils Relative % 60 %  ? Neutro Abs 5.2 1.7 - 7.7 K/uL  ? Lymphocytes Relative 31 %  ? Lymphs Abs 2.7 0.7 - 4.0 K/uL  ? Monocytes Relative 6 %  ? Monocytes Absolute 0.5 0.1 - 1.0 K/uL  ? Eosinophils Relative 1 %  ? Eosinophils Absolute 0.1 0.0 - 0.5 K/uL  ? Basophils Relative 1 %  ? Basophils Absolute 0.1 0.0 - 0.1 K/uL  ? Immature Granulocytes 1 %  ? Abs Immature Granulocytes 0.06 0.00 - 0.07 K/uL  ? ?   ?Assessment & Plan:  ? ?Problem List Items Addressed This Visit   ? ?  ? Other  ? Urinary symptom or sign - Primary  ?  Acute for one week with some improvement with home treatment.  UA today noting 3+ BLD with cycle, 3+ glucose -- no nitrites or bacteria.  Wet prep + yeast.  Will send in Diflucan to take x one dose and recommend continue hydration at home.  Send urine for culture and treat as needed.   ? ?  ?  ? Relevant Orders  ? Urinalysis, Routine w reflex microscopic  ? WET PREP FOR [place], YEAST, CLUE  ? Urine Culture  ?  ? ?Follow up plan: ?Return if symptoms worsen or fail to improve. ? ? ? ? ? ?

## 2021-12-09 NOTE — Progress Notes (Signed)
Contacted via MyChart   Waiting for final culture results on urine, if needed will send in antibiotic:)

## 2021-12-10 LAB — URINE CULTURE

## 2021-12-11 MED ORDER — AMOXICILLIN-POT CLAVULANATE 875-125 MG PO TABS: 1.0000 | ORAL_TABLET | Freq: Two times a day (BID) | ORAL | 0 refills | Status: AC

## 2021-12-11 NOTE — Addendum Note (Signed)
Addended by: Marnee Guarneri T on: 12/11/2021 11:18 AM   Modules accepted: Orders

## 2021-12-11 NOTE — Progress Notes (Signed)
Contacted via MyChart   Good morning Luara, your urine is showing growth of bacteria.  Often we do not treat unless greater then 100,00, your culture shows 50,000 to 100,000.  Since you were having symptoms I am going to send in Augmentin which this bacteria is treatable with, as it may be at end or beginning of infection and would be better to treat.  Any questions? Keep being amazing!!  Thank you for allowing me to participate in your care.  I appreciate you. Kindest regards, Jyron Turman

## 2022-02-14 LAB — HM MAMMOGRAPHY

## 2022-05-16 LAB — HEMOGLOBIN A1C: Hemoglobin A1C: 8.4

## 2022-06-19 NOTE — Progress Notes (Deleted)
There were no vitals taken for this visit.   Subjective:    Patient ID: Ellen May, female    DOB: 1971/01/07, 51 y.o.   MRN: [id number]  HPI: Ellen May is a 51 y.o. female  No chief complaint on file.   Patient presents to clinic today to discuss getting motion sickness patches for her upcoming trip.  Patient denies other concerns at visit today.   HYPERTENSION Hypertension status: uncontrolled  Satisfied with current treatment? no Duration of hypertension: years BP monitoring frequency:   occasionally BP range: 130/80 BP medication side effects:  no Medication compliance: excellent compliance Previous BP meds:lisinopril Aspirin: no Recurrent headaches: no Visual changes: no Palpitations: no Dyspnea: no Chest pain: no Lower extremity edema: no Dizzy/lightheaded: no  DIABETES Hypoglycemic episodes:{Blank single:19197::"yes","no"} Polydipsia/polyuria: {Blank single:19197::"yes","no"} Visual disturbance: {Blank single:19197::"yes","no"} Chest pain: {Blank single:19197::"yes","no"} Paresthesias: {Blank single:19197::"yes","no"} Glucose Monitoring: {Blank single:19197::"yes","no"}  Accucheck frequency: {Blank single:19197::"Not Checking","Daily","BID","TID"}  Fasting glucose:  Post prandial:  Evening:  Before meals: Taking Insulin?: {Blank single:19197::"yes","no"}  Long acting insulin:  Short acting insulin: Blood Pressure Monitoring: {Blank single:19197::"not checking","rarely","daily","weekly","monthly","a few times a day","a few times a week","a few times a month"} Retinal Examination: {Blank single:19197::"Up to Date","Not up to Date"} Foot Exam: {Blank single:19197::"Up to Date","Not up to Date"} Diabetic Education: {Blank single:19197::"Completed","Not Completed"} Pneumovax: {Blank single:19197::"Up to Date","Not up to Date","unknown"} Influenza: {Blank single:19197::"Up to Date","Not up to Date","unknown"} Aspirin: {Blank  single:19197::"yes","no"}  ANEMIA Anemia status: {Blank single:19197::"controlled","uncontrolled","better","worse","exacerbated","stable"} Etiology of anemia: Duration of anemia treatment:  Compliance with treatment: {Blank single:19197::"excellent compliance","good compliance","fair compliance","poor compliance"} Iron supplementation side effects: {Blank single:19197::"yes","no"} Severity of anemia: {Blank single:19197::"mild","moderate","severe"} Fatigue: {Blank single:19197::"yes","no"} Decreased exercise tolerance: {Blank single:19197::"yes","no"}  Dyspnea on exertion: {Blank single:19197::"yes","no"} Palpitations: {Blank single:19197::"yes","no"} Bleeding: {Blank single:19197::"yes","no"} Pica: {Blank single:19197::"yes","no"}    Relevant past medical, surgical, family and social history reviewed and updated as indicated. Interim medical history since our last visit reviewed. Allergies and medications reviewed and updated.  Review of Systems  Eyes:  Negative for visual disturbance.  Respiratory:  Negative for cough, chest tightness and shortness of breath.   Cardiovascular:  Negative for chest pain, palpitations and leg swelling.  Neurological:  Negative for dizziness and headaches.    Per HPI unless specifically indicated above     Objective:    There were no vitals taken for this visit.  Wt Readings from Last 3 Encounters:  12/06/21 149 lb (67.6 kg)  07/06/21 152 lb 12.8 oz (69.3 kg)  04/19/21 148 lb (67.1 kg)    Physical Exam Vitals and nursing note reviewed.  Constitutional:      General: She is not in acute distress.    Appearance: Normal appearance. She is normal weight. She is not ill-appearing, toxic-appearing or diaphoretic.  HENT:     Head: Normocephalic.     Right Ear: External ear normal.     Left Ear: External ear normal.     Nose: Nose normal.     Mouth/Throat:     Mouth: Mucous membranes are moist.     Pharynx: Oropharynx is clear.  Eyes:      General:        Right eye: No discharge.        Left eye: No discharge.     Extraocular Movements: Extraocular movements intact.     Conjunctiva/sclera: Conjunctivae normal.     Pupils: Pupils are equal, round, and reactive to light.  Cardiovascular:     Rate and Rhythm: Normal rate and regular rhythm.  Heart sounds: No murmur heard. Pulmonary:     Effort: Pulmonary effort is normal. No respiratory distress.     Breath sounds: Normal breath sounds. No wheezing or rales.  Musculoskeletal:     Cervical back: Normal range of motion and neck supple.  Skin:    General: Skin is warm and dry.     Capillary Refill: Capillary refill takes less than 2 seconds.  Neurological:     General: No focal deficit present.     Mental Status: She is alert and oriented to person, place, and time. Mental status is at baseline.  Psychiatric:        Mood and Affect: Mood normal.        Behavior: Behavior normal.        Thought Content: Thought content normal.        Judgment: Judgment normal.     Results for orders placed or performed in visit on 02/15/22  HM MAMMOGRAPHY  Result Value Ref Range   HM Mammogram 0-4 Bi-Rad 0-4 Bi-Rad, Self Reported Normal      Assessment & Plan:   Problem List Items Addressed This Visit       Cardiovascular and Mediastinum   Hypertension     Endocrine   Poorly controlled type 2 diabetes mellitus ([place]) - Primary     Other   Hyperlipidemia   Iron deficiency anemia     Follow up plan: No follow-ups on file.

## 2022-06-20 ENCOUNTER — Ambulatory Visit: Payer: BC Managed Care – PPO | Admitting: Nurse Practitioner

## 2022-06-20 DIAGNOSIS — D500 Iron deficiency anemia secondary to blood loss (chronic): Secondary | ICD-10-CM

## 2022-06-20 DIAGNOSIS — E1165 Type 2 diabetes mellitus with hyperglycemia: Secondary | ICD-10-CM

## 2022-06-20 DIAGNOSIS — E7800 Pure hypercholesterolemia, unspecified: Secondary | ICD-10-CM

## 2022-06-20 DIAGNOSIS — I10 Essential (primary) hypertension: Secondary | ICD-10-CM

## 2022-06-26 ENCOUNTER — Encounter: Payer: Self-pay | Admitting: Nurse Practitioner

## 2022-06-26 ENCOUNTER — Ambulatory Visit: Payer: BC Managed Care – PPO | Admitting: Nurse Practitioner

## 2022-06-26 VITALS — BP 130/84 | HR 108 | Temp 98.7°F | Wt 149.2 lb

## 2022-06-26 DIAGNOSIS — D500 Iron deficiency anemia secondary to blood loss (chronic): Secondary | ICD-10-CM | POA: Diagnosis not present

## 2022-06-26 DIAGNOSIS — I10 Essential (primary) hypertension: Secondary | ICD-10-CM | POA: Diagnosis not present

## 2022-06-26 DIAGNOSIS — E7800 Pure hypercholesterolemia, unspecified: Secondary | ICD-10-CM

## 2022-06-26 DIAGNOSIS — E1165 Type 2 diabetes mellitus with hyperglycemia: Secondary | ICD-10-CM | POA: Diagnosis not present

## 2022-06-26 NOTE — Assessment & Plan Note (Signed)
Chronic. Not under good control with A1c of 8.4- following with endocrinology. Continue to follow with them. Call with any concerns. Now on [place] and Ozempic.  Doing well with medications.  Follow up in 6 months.  Call sooner if concerns arise.

## 2022-06-26 NOTE — Progress Notes (Signed)
BP 130/84   Pulse (!) 108   Temp 98.7 F (37.1 C) (Oral)   Wt 149 lb 3.2 oz (67.7 kg)   SpO2 98%   BMI 24.83 kg/m    Subjective:    Patient ID: Ellen May, female    DOB: 1970/10/07, 51 y.o.   MRN: [id number]  HPI: Ellen May is a 51 y.o. female  Chief Complaint  Patient presents with   Diabetes    See Dr. Gabriel Carina, eye exam requested from [place]   Hyperlipidemia   Hypertension   Anemia     HYPERTENSION Hypertension status: uncontrolled  Satisfied with current treatment? no Duration of hypertension: years BP monitoring frequency:   occasionally BP range: 130/80 BP medication side effects:  no Medication compliance: excellent compliance Previous BP meds:lisinopril Aspirin: no Recurrent headaches: no Visual changes: no Palpitations: no Dyspnea: no Chest pain: no Lower extremity edema: no Dizzy/lightheaded: no  DIABETES Patient is followed by Dr. Gabriel Carina for diabetes.   Hypoglycemic episodes:no Polydipsia/polyuria: no Visual disturbance: no Chest pain: no Paresthesias: no Glucose Monitoring: yes  Accucheck frequency: Not Checking  Fasting glucose:  Post prandial:  Evening:  Before meals: Taking Insulin?: yes  Long acting insulin: 26u  Short acting insulin: Blood Pressure Monitoring: daily Retinal Examination:  requested from [place] Exam: Up to Date Diabetic Education: Not Completed Pneumovax: Up to Date Influenza: Up to Date Aspirin: no  ANEMIA Anemia status: controlled Etiology of anemia: IDA Duration of anemia treatment:  Compliance with treatment: excellent compliance Iron supplementation side effects: yes Severity of anemia: mild Fatigue: no Decreased exercise tolerance: no  Dyspnea on exertion: no Palpitations: no Bleeding: no Pica: no    Relevant past medical, surgical, family and social history reviewed and updated as indicated. Interim medical history since our last visit reviewed. Allergies and  medications reviewed and updated.  Review of Systems  Constitutional:  Negative for fatigue.  Eyes:  Negative for visual disturbance.  Respiratory:  Negative for cough, chest tightness and shortness of breath.   Cardiovascular:  Negative for chest pain, palpitations and leg swelling.  Endocrine: Negative for polydipsia and polyuria.  Neurological:  Negative for dizziness, numbness and headaches.    Per HPI unless specifically indicated above     Objective:    BP 130/84   Pulse (!) 108   Temp 98.7 F (37.1 C) (Oral)   Wt 149 lb 3.2 oz (67.7 kg)   SpO2 98%   BMI 24.83 kg/m   Wt Readings from Last 3 Encounters:  06/26/22 149 lb 3.2 oz (67.7 kg)  12/06/21 149 lb (67.6 kg)  07/06/21 152 lb 12.8 oz (69.3 kg)    Physical Exam Vitals and nursing note reviewed.  Constitutional:      General: She is not in acute distress.    Appearance: Normal appearance. She is normal weight. She is not ill-appearing, toxic-appearing or diaphoretic.  HENT:     Head: Normocephalic.     Right Ear: External ear normal.     Left Ear: External ear normal.     Nose: Nose normal.     Mouth/Throat:     Mouth: Mucous membranes are moist.     Pharynx: Oropharynx is clear.  Eyes:     General:        Right eye: No discharge.        Left eye: No discharge.     Extraocular Movements: Extraocular movements intact.     Conjunctiva/sclera: Conjunctivae normal.  Pupils: Pupils are equal, round, and reactive to light.  Cardiovascular:     Rate and Rhythm: Normal rate and regular rhythm.     Heart sounds: No murmur heard. Pulmonary:     Effort: Pulmonary effort is normal. No respiratory distress.     Breath sounds: Normal breath sounds. No wheezing or rales.  Musculoskeletal:     Cervical back: Normal range of motion and neck supple.  Skin:    General: Skin is warm and dry.     Capillary Refill: Capillary refill takes less than 2 seconds.  Neurological:     General: No focal deficit present.      Mental Status: She is alert and oriented to person, place, and time. Mental status is at baseline.  Psychiatric:        Mood and Affect: Mood normal.        Behavior: Behavior normal.        Thought Content: Thought content normal.        Judgment: Judgment normal.     Results for orders placed or performed in visit on 06/26/22  Hemoglobin A1c  Result Value Ref Range   Hemoglobin A1C 8.4       Assessment & Plan:   Problem List Items Addressed This Visit       Cardiovascular and Mediastinum   Hypertension    Chronic. Elevated at visit today.  Controlled at home.  Follow up in 6 months for reevaluation.         Endocrine   Poorly controlled type 2 diabetes mellitus ([place]) - Primary    Chronic. Not under good control with A1c of 8.4- following with endocrinology. Continue to follow with them. Call with any concerns. Now on [place] and Ozempic.  Doing well with medications.  Follow up in 6 months.  Call sooner if concerns arise.      Relevant Orders   Comp Met (CMET)   HgB A1c     Other   Hyperlipidemia    Chronic.  Controlled.  Continue with current medication regimen of Atorvastatin.  Labs ordered today.  Return to clinic in 6 months for reevaluation.  Call sooner if concerns arise.        Relevant Orders   Lipid Profile   Iron deficiency anemia    Labs ordered at visit today.  Will make recommendations based on lab results.        Relevant Orders   CBC w/Diff   Iron, TIBC and Ferritin Panel     Follow up plan: Return in about 6 months (around 12/26/2022) for HTN, HLD, DM2 FU.

## 2022-06-26 NOTE — Assessment & Plan Note (Signed)
Labs ordered at visit today.  Will make recommendations based on lab results.   

## 2022-06-26 NOTE — Assessment & Plan Note (Signed)
Chronic.  Controlled.  Continue with current medication regimen of Atorvastatin.  Labs ordered today.  Return to clinic in 6 months for reevaluation.  Call sooner if concerns arise.   

## 2022-06-26 NOTE — Assessment & Plan Note (Signed)
Chronic. Elevated at visit today.  Controlled at home.  Follow up in 6 months for reevaluation.

## 2022-06-27 ENCOUNTER — Encounter: Payer: Self-pay | Admitting: Nurse Practitioner

## 2022-06-27 LAB — COMPREHENSIVE METABOLIC PANEL
ALT: 36 IU/L — ABNORMAL HIGH (ref 0–32)
AST: 26 IU/L (ref 0–40)
Albumin/Globulin Ratio: 1.7 (ref 1.2–2.2)
Albumin: 4.3 g/dL (ref 3.9–4.9)
Alkaline Phosphatase: 97 IU/L (ref 44–121)
BUN/Creatinine Ratio: 17 (ref 9–23)
BUN: 9 mg/dL (ref 6–24)
Bilirubin Total: 0.2 mg/dL (ref 0.0–1.2)
CO2: 18 mmol/L — ABNORMAL LOW (ref 20–29)
Calcium: 9.8 mg/dL (ref 8.7–10.2)
Chloride: 105 mmol/L (ref 96–106)
Creatinine, Ser: 0.53 mg/dL — ABNORMAL LOW (ref 0.57–1.00)
Globulin, Total: 2.6 g/dL (ref 1.5–4.5)
Glucose: 187 mg/dL — ABNORMAL HIGH (ref 70–99)
Potassium: 4.3 mmol/L (ref 3.5–5.2)
Sodium: 140 mmol/L (ref 134–144)
Total Protein: 6.9 g/dL (ref 6.0–8.5)
eGFR: 113 mL/min/{1.73_m2} (ref >59)

## 2022-06-27 LAB — LIPID PANEL
Chol/HDL Ratio: 3.6 ratio (ref 0.0–4.4)
Cholesterol, Total: 144 mg/dL (ref 100–199)
HDL: 40 mg/dL (ref >39)
LDL Chol Calc (NIH): 86 mg/dL (ref 0–99)
Triglycerides: 95 mg/dL (ref 0–149)
VLDL Cholesterol Cal: 18 mg/dL (ref 5–40)

## 2022-06-27 LAB — CBC WITH DIFFERENTIAL/PLATELET
Basophils Absolute: 0.1 10*3/uL (ref 0.0–0.2)
Basos: 1 %
EOS (ABSOLUTE): 0.2 10*3/uL (ref 0.0–0.4)
Eos: 2 %
Hematocrit: 46.2 % (ref 34.0–46.6)
Hemoglobin: 14.9 g/dL (ref 11.1–15.9)
Immature Grans (Abs): 0.1 10*3/uL (ref 0.0–0.1)
Immature Granulocytes: 1 %
Lymphocytes Absolute: 2.3 10*3/uL (ref 0.7–3.1)
Lymphs: 26 %
MCH: 25.8 pg — ABNORMAL LOW (ref 26.6–33.0)
MCHC: 32.3 g/dL (ref 31.5–35.7)
MCV: 80 fL (ref 79–97)
Monocytes Absolute: 0.6 10*3/uL (ref 0.1–0.9)
Monocytes: 7 %
Neutrophils Absolute: 5.6 10*3/uL (ref 1.4–7.0)
Neutrophils: 63 %
Platelets: 274 10*3/uL (ref 150–450)
RBC: 5.77 x10E6/uL — ABNORMAL HIGH (ref 3.77–5.28)
RDW: 13.3 % (ref 11.7–15.4)
WBC: 8.9 10*3/uL (ref 3.4–10.8)

## 2022-06-27 LAB — IRON,TIBC AND FERRITIN PANEL
Ferritin: 22 ng/mL (ref 15–150)
Iron Saturation: 10 % — ABNORMAL LOW (ref 15–55)
Iron: 37 ug/dL (ref 27–159)
Total Iron Binding Capacity: 354 ug/dL (ref 250–450)
UIBC: 317 ug/dL (ref 131–425)

## 2022-06-27 LAB — HEMOGLOBIN A1C
Est. average glucose Bld gHb Est-mCnc: 194 mg/dL
Hgb A1c MFr Bld: 8.4 % — ABNORMAL HIGH (ref 4.8–5.6)

## 2022-06-27 MED ORDER — VALACYCLOVIR HCL 1 G PO TABS: 1000.0000 mg | ORAL_TABLET | Freq: Two times a day (BID) | ORAL | 2 refills | Status: AC

## 2022-06-27 NOTE — Progress Notes (Signed)
Hi Ellen May. It was good to see you yesterday.  Your lab work shows that your iron saturation is low.  However, your complete blood count looks great.  I do recommend following up with hematology to see if you need another iron infusion.  Otherwise, your lab work looks good.  Continue with your current medication regimen.  Follow up as discussed.

## 2022-08-10 ENCOUNTER — Inpatient Hospital Stay: Payer: BC Managed Care – PPO | Admitting: Oncology

## 2022-08-31 ENCOUNTER — Encounter: Payer: Self-pay | Admitting: Oncology

## 2022-08-31 ENCOUNTER — Inpatient Hospital Stay: Payer: BC Managed Care – PPO | Attending: Oncology | Admitting: Oncology

## 2022-08-31 ENCOUNTER — Inpatient Hospital Stay: Payer: BC Managed Care – PPO

## 2022-08-31 VITALS — BP 148/85 | HR 104 | Temp 98.2°F

## 2022-08-31 DIAGNOSIS — Z808 Family history of malignant neoplasm of other organs or systems: Secondary | ICD-10-CM | POA: Diagnosis not present

## 2022-08-31 DIAGNOSIS — D751 Secondary polycythemia: Secondary | ICD-10-CM | POA: Diagnosis present

## 2022-08-31 DIAGNOSIS — D509 Iron deficiency anemia, unspecified: Secondary | ICD-10-CM | POA: Diagnosis not present

## 2022-08-31 LAB — FERRITIN: Ferritin: 13 ng/mL (ref 11–307)

## 2022-08-31 LAB — CBC WITH DIFFERENTIAL/PLATELET
Abs Immature Granulocytes: 0.05 10*3/uL (ref 0.00–0.07)
Basophils Absolute: 0.1 10*3/uL (ref 0.0–0.1)
Basophils Relative: 1 %
Eosinophils Absolute: 0.1 10*3/uL (ref 0.0–0.5)
Eosinophils Relative: 2 %
HCT: 44.2 % (ref 36.0–46.0)
Hemoglobin: 14.4 g/dL (ref 12.0–15.0)
Immature Granulocytes: 1 %
Lymphocytes Relative: 34 %
Lymphs Abs: 2.8 10*3/uL (ref 0.7–4.0)
MCH: 25.6 pg — ABNORMAL LOW (ref 26.0–34.0)
MCHC: 32.6 g/dL (ref 30.0–36.0)
MCV: 78.5 fL — ABNORMAL LOW (ref 80.0–100.0)
Monocytes Absolute: 0.5 10*3/uL (ref 0.1–1.0)
Monocytes Relative: 6 %
Neutro Abs: 4.7 10*3/uL (ref 1.7–7.7)
Neutrophils Relative %: 56 %
Platelets: 304 10*3/uL (ref 150–400)
RBC: 5.63 MIL/uL — ABNORMAL HIGH (ref 3.87–5.11)
RDW: 13.3 % (ref 11.5–15.5)
WBC: 8.2 10*3/uL (ref 4.0–10.5)
nRBC: 0 % (ref 0.0–0.2)

## 2022-08-31 LAB — IRON AND TIBC
Iron: 55 ug/dL (ref 28–170)
Saturation Ratios: 14 % (ref 10.4–31.8)
TIBC: 403 ug/dL (ref 250–450)
UIBC: 348 ug/dL

## 2022-08-31 LAB — COMPREHENSIVE METABOLIC PANEL
ALT: 28 U/L (ref 0–44)
AST: 24 U/L (ref 15–41)
Albumin: 3.9 g/dL (ref 3.5–5.0)
Alkaline Phosphatase: 69 U/L (ref 38–126)
Anion gap: 9 (ref 5–15)
BUN: 11 mg/dL (ref 6–20)
CO2: 24 mmol/L (ref 22–32)
Calcium: 9 mg/dL (ref 8.9–10.3)
Chloride: 106 mmol/L (ref 98–111)
Creatinine, Ser: 0.51 mg/dL (ref 0.44–1.00)
GFR, Estimated: >60 mL/min (ref >60)
Glucose, Bld: 145 mg/dL — ABNORMAL HIGH (ref 70–99)
Potassium: 4 mmol/L (ref 3.5–5.1)
Sodium: 139 mmol/L (ref 135–145)
Total Bilirubin: 0.5 mg/dL (ref 0.3–1.2)
Total Protein: 7.4 g/dL (ref 6.5–8.1)

## 2022-08-31 LAB — RETIC PANEL
Immature Retic Fract: 10 % (ref 2.3–15.9)
RBC.: 5.59 MIL/uL — ABNORMAL HIGH (ref 3.87–5.11)
Retic Count, Absolute: 81.1 10*3/uL (ref 19.0–186.0)
Retic Ct Pct: 1.5 % (ref 0.4–3.1)
Reticulocyte Hemoglobin: 28.1 pg (ref >27.9)

## 2022-08-31 NOTE — Progress Notes (Signed)
Hematology/Oncology Progress note [phone] [phone]   CHIEF COMPLAINTS/REASON FOR VISIT:  Evaluation of anemia  ASSESSMENT & PLAN:   Erythrocytosis History of erythrocytosis, no smoking, negative sleep study Check JAK2 V617F mutation negative, with reflex to other mutations CALR, MPL, JAK 2 Ex 12-15 mutations BCR ABL1 FISH, EPO  Iron deficiency anemia Possible dysregulated iron metabolism due to erythrocytosis.  No anemia, I will hold off iron supplementation.    Orders Placed This Encounter  Procedures   CBC with Differential/Platelet    Standing Status:   Future    Number of Occurrences:   1    Standing Expiration Date:   09/01/2023   Comprehensive metabolic panel    Standing Status:   Future    Number of Occurrences:   1    Standing Expiration Date:   09/01/2023   BCR-ABL1 FISH    Standing Status:   Future    Number of Occurrences:   1    Standing Expiration Date:   09/01/2023   Carbon monoxide, blood (performed at ref lab)    Standing Status:   Future    Number of Occurrences:   1    Standing Expiration Date:   09/01/2023   Erythropoietin    Standing Status:   Future    Number of Occurrences:   1    Standing Expiration Date:   09/01/2023   JAK2 V617F rfx CALR/MPL/E12-15    Standing Status:   Future    Number of Occurrences:   1    Standing Expiration Date:   09/01/2023   Iron and TIBC    Standing Status:   Future    Number of Occurrences:   1    Standing Expiration Date:   09/01/2023   Ferritin    Standing Status:   Future    Number of Occurrences:   1    Standing Expiration Date:   09/01/2023   Retic Panel    Standing Status:   Future    Number of Occurrences:   1    Standing Expiration Date:   09/01/2023   Follow up TBD All questions were answered. The patient knows to call the clinic with any problems, questions or concerns.  Earlie Server, MD, PhD [facility] Hematology Oncology 08/31/2022   HISTORY OF PRESENTING ILLNESS:  Ellen May  is a  52 y.o.  female with PMH listed below who was referred to me for evaluation of anemia Previously received IV Venofer and tolerate well.   INTERVAL HISTORY Ellen May is a 52 y.o. female who has above history reviewed by me today presents for follow up visit for iron deficiency.  06/26/22 iron saturation 10,  hemoglobin 14.9 11/14/2021 Sleep study negative.   Review of Systems  Constitutional:  Positive for fatigue. Negative for appetite change, chills and fever.  HENT:   Negative for hearing loss and voice change.   Eyes:  Negative for eye problems.  Respiratory:  Negative for chest tightness and cough.   Cardiovascular:  Negative for chest pain.  Gastrointestinal:  Negative for abdominal distention, abdominal pain and blood in stool.  Endocrine: Negative for hot flashes.  Genitourinary:  Positive for menstrual problem. Negative for difficulty urinating and frequency.   Musculoskeletal:  Negative for arthralgias.  Skin:  Negative for itching and rash.  Neurological:  Negative for extremity weakness.  Hematological:  Negative for adenopathy.  Psychiatric/Behavioral:  Negative for confusion.      MEDICAL HISTORY:  Past Medical  History:  Diagnosis Date   Anemia    COVID 2020   COVID 2021   cough, sinus trouble, body aches, low fever x 2 weeks all symptoms resolved   Dysplastic nevus 09/18/2019   Right lat hip above waistline. Mild atypia, limited margins free.   Hyperlipidemia    Hypertension    Situational anxiety    Type 2 dm     SURGICAL HISTORY: Past Surgical History:  Procedure Laterality Date   COLONOSCOPY WITH PROPOFOL N/A 01/04/2021   Procedure: COLONOSCOPY WITH PROPOFOL;  Surgeon: Lucilla Lame, MD;  Location: [facility] ENDOSCOPY;  Service: Endoscopy;  Laterality: N/A;   DILATATION & CURETTAGE/HYSTEROSCOPY WITH MYOSURE N/A 03/23/2021   Procedure: DILATATION & CURETTAGE/HYSTEROSCOPY WITH MYOSURE;  Surgeon: Brien Few, MD;  Location: [place];  Service: Gynecology;  Laterality: N/A;   ESOPHAGOGASTRODUODENOSCOPY (EGD) WITH PROPOFOL N/A 01/04/2021   Procedure: ESOPHAGOGASTRODUODENOSCOPY (EGD) WITH PROPOFOL;  Surgeon: Lucilla Lame, MD;  Location: [facility] ENDOSCOPY;  Service: Endoscopy;  Laterality: N/A;   HYSTEROSCOPY WITH NOVASURE N/A 03/23/2021   Procedure: HYSTEROSCOPY WITH NOVASURE;  Surgeon: Brien Few, MD;  Location: [place];  Service: Gynecology;  Laterality: N/A;    SOCIAL HISTORY: Social History   Socioeconomic History   Marital status: Married    Spouse name: Not on file   Number of children: Not on file   Years of education: Not on file   Highest education level: Not on file  Occupational History   Not on file  Tobacco Use   Smoking status: Never   Smokeless tobacco: Never  Vaping Use   Vaping Use: Never used  Substance and Sexual Activity   Alcohol use: No   Drug use: Never   Sexual activity: Yes  Other Topics Concern   Not on file  Social History Narrative   Not on file   Social Determinants of Health   Financial Resource Strain: Not on file  Food Insecurity: Not on file  Transportation Needs: Not on file  Physical Activity: Not on file  Stress: Not on file  Social Connections: Not on file  Intimate Partner Violence: Not on file    FAMILY HISTORY: Family History  Problem Relation Age of Onset   Diabetes Mother    Hypertension Mother    Skin cancer Mother    Heart disease Father    Alcohol abuse Father    Hypertension Father    Diabetes Sister    Arthritis Sister        RA    ALLERGIES:  is allergic to [place].  MEDICATIONS:  Current Outpatient Medications  Medication Sig Dispense Refill   atorvastatin (LIPITOR) 20 MG tablet Take 20 mg by mouth daily. 90 tablet 3   empagliflozin (JARDIANCE) 25 MG TABS tablet Take 1 tablet (25 mg total) by mouth daily. 30 tablet 2   insulin degludec (TRESIBA FLEXTOUCH) 200 UNIT/ML FlexTouch Pen Inject 34 Units  into the skin daily.     Lancets (ONETOUCH DELICA PLUS [id number]) MISC      lisinopril (ZESTRIL) 20 MG tablet Take 20 mg by mouth daily.     metFORMIN (GLUCOPHAGE-XR) 500 MG 24 hr tablet Take 2 tablets (1,000 mg total) by mouth daily. 120 tablet 2   ONE TOUCH ULTRA TEST test strip      Semaglutide 0.25 or 0.5 MG/DOSE SOPN Inject 1 mg into the skin once a week. Friday     scopolamine (TRANSDERM-SCOP) 1 MG/3DAYS Place 1 patch (1.5 mg total) onto the  skin every 3 (three) days. (Patient not taking: Reported on 08/31/2022) 10 patch 1   No current facility-administered medications for this visit.     PHYSICAL EXAMINATION: ECOG PERFORMANCE STATUS: 1 - Symptomatic but completely ambulatory Vitals:   08/31/22 1426  BP: (!) 148/85  Pulse: (!) 104  Temp: 98.2 F (36.8 C)   There were no vitals filed for this visit.   Physical Exam Constitutional:      General: She is not in acute distress. HENT:     Head: Normocephalic and atraumatic.  Eyes:     General: No scleral icterus. Cardiovascular:     Rate and Rhythm: Normal rate and regular rhythm.     Heart sounds: Normal heart sounds.  Pulmonary:     Effort: Pulmonary effort is normal. No respiratory distress.     Breath sounds: No wheezing.  Abdominal:     General: Bowel sounds are normal. There is no distension.     Palpations: Abdomen is soft.  Musculoskeletal:        General: No deformity. Normal range of motion.     Cervical back: Normal range of motion and neck supple.  Skin:    General: Skin is warm and dry.     Findings: No erythema or rash.  Neurological:     Mental Status: She is alert and oriented to person, place, and time. Mental status is at baseline.     Cranial Nerves: No cranial nerve deficit.     Coordination: Coordination normal.  Psychiatric:        Mood and Affect: Mood normal.      LABORATORY DATA:  I have reviewed the data as listed    Latest Ref Rng & Units 08/31/2022    3:08 PM 06/26/2022    1:33 PM  08/15/2021    2:41 PM  CBC  WBC 4.0 - 10.5 K/uL 8.2  8.9  8.7   Hemoglobin 12.0 - 15.0 g/dL 14.4  14.9  15.9   Hematocrit 36.0 - 46.0 % 44.2  46.2  47.4   Platelets 150 - 400 K/uL 304  274  282       Latest Ref Rng & Units 08/31/2022    3:08 PM 06/26/2022    1:33 PM 03/21/2021    9:01 AM  CMP  Glucose 70 - 99 mg/dL 145  187  192   BUN 6 - 20 mg/dL '11  9  15   '$ Creatinine 0.44 - 1.00 mg/dL 0.51  0.53  0.46   Sodium 135 - 145 mmol/L 139  140  137   Potassium 3.5 - 5.1 mmol/L 4.0  4.3  4.6   Chloride 98 - 111 mmol/L 106  105  107   CO2 22 - 32 mmol/L '24  18  23   '$ Calcium 8.9 - 10.3 mg/dL 9.0  9.8  9.6   Total Protein 6.5 - 8.1 g/dL 7.4  6.9    Total Bilirubin 0.3 - 1.2 mg/dL 0.5  0.2    Alkaline Phos 38 - 126 U/L 69  97    AST 15 - 41 U/L 24  26    ALT 0 - 44 U/L 28  36       Iron/TIBC/Ferritin/ %Sat    Component Value Date/Time   IRON 55 08/31/2022 1508   IRON 37 06/26/2022 1333   TIBC 403 08/31/2022 1508   TIBC 354 06/26/2022 1333   FERRITIN 13 08/31/2022 1508   FERRITIN 22 06/26/2022 1333   IRONPCTSAT  14 08/31/2022 1508   IRONPCTSAT 10 (L) 06/26/2022 1333

## 2022-08-31 NOTE — Assessment & Plan Note (Signed)
Possible dysregulated iron metabolism due to erythrocytosis.  No anemia, I will hold off iron supplementation.

## 2022-08-31 NOTE — Progress Notes (Signed)
Pt here for follow up. Pt concerned about low iron levels

## 2022-08-31 NOTE — Assessment & Plan Note (Signed)
History of erythrocytosis, no smoking, negative sleep study Check JAK2 V617F mutation negative, with reflex to other mutations CALR, MPL, JAK 2 Ex 12-15 mutations BCR ABL1 FISH, EPO

## 2022-09-01 LAB — CARBON MONOXIDE, BLOOD (PERFORMED AT REF LAB): Carbon Monoxide, Blood: 3.1 % (ref 0.0–3.6)

## 2022-09-01 LAB — ERYTHROPOIETIN: Erythropoietin: 17 m[IU]/mL (ref 2.6–18.5)

## 2022-09-05 LAB — BCR-ABL1 FISH
Cells Analyzed: 200
Cells Counted: 200

## 2022-09-07 ENCOUNTER — Ambulatory Visit: Payer: Self-pay | Admitting: *Deleted

## 2022-09-07 NOTE — Telephone Encounter (Signed)
Reason for Disposition  [1] MODERATE pain (e.g., interferes with normal activities) AND [2] high-risk adult (e.g., age > 23 years, osteoporosis, chronic steroid use)    Patient not a high risk adult- but did have a pretty hard fall.  Answer Assessment - Initial Assessment Questions 1. MECHANISM: "How did the injury happen?" (Consider the possibility of domestic violence or elder abuse)     Tripped- hit on side/back to the floor 2. ONSET: "When did the injury happen?" (Minutes or hours ago)     Yesterday late afternoon 3. LOCATION: "What part of the back is injured?"     Right -mid- upper back 4. SEVERITY: "Can you move the back normally?"     Yes- feels pain with stretching/movement 5. PAIN: "Is there any pain?" If Yes, ask: "How bad is the pain?"   (Scale 1-10; or mild, moderate, severe)     In movement- 6-7  6. CORD SYMPTOMS: Any weakness or numbness of the arms or legs?"     no 7. SIZE: For cuts, bruises, or swelling, ask: "How large is it?" (e.g., inches or centimeters)     no 8. TETANUS: For any breaks in the skin, ask: "When was the last tetanus booster?"     na 9. OTHER SYMPTOMS: "Do you have any other symptoms?" (e.g., abdomen pain, blood in urine)     no  Protocols used: Back Injury-A-AH

## 2022-09-07 NOTE — Telephone Encounter (Signed)
  Chief Complaint: fall- hit R mid-upper back Symptoms: pain increased throughout day Frequency: fell 09/06/22- late afternoon Pertinent Negatives: Patient denies abdomen pain, blood in urine  Disposition: '[]'$ ED /'[]'$ Urgent Care (no appt availability in office) / '[x]'$ Appointment(In office/virtual)/ '[]'$  [facility]/ '[]'$ Home Care/ '[]'$ Refused Recommended Disposition /'[]'$ [facility]/ '[]'$  Follow-up with PCP Additional Notes: Patient has had increased pain throughout the day- requesting imaging- appointment scheduled

## 2022-09-08 ENCOUNTER — Ambulatory Visit: Payer: BC Managed Care – PPO | Admitting: Physician Assistant

## 2022-09-08 ENCOUNTER — Ambulatory Visit
Admission: RE | Admit: 2022-09-08 | Discharge: 2022-09-08 | Disposition: A | Discharge status: Home / Self Care | Payer: BC Managed Care – PPO | Attending: Physician Assistant | Admitting: Physician Assistant

## 2022-09-08 ENCOUNTER — Encounter: Payer: Self-pay | Admitting: Physician Assistant

## 2022-09-08 ENCOUNTER — Ambulatory Visit
Admission: RE | Admit: 2022-09-08 | Discharge: 2022-09-08 | Disposition: A | Discharge status: Home / Self Care | Payer: BC Managed Care – PPO | Source: Ambulatory Visit | Attending: Physician Assistant | Admitting: Physician Assistant

## 2022-09-08 VITALS — BP 116/74 | HR 93 | Temp 98.3°F | Ht 65.0 in | Wt 147.3 lb

## 2022-09-08 DIAGNOSIS — Y92000 Kitchen of unspecified non-institutional (private) residence as  the place of occurrence of the external cause: Secondary | ICD-10-CM | POA: Insufficient documentation

## 2022-09-08 DIAGNOSIS — R0781 Pleurodynia: Secondary | ICD-10-CM

## 2022-09-08 DIAGNOSIS — Y92009 Unspecified place in unspecified non-institutional (private) residence as the place of occurrence of the external cause: Secondary | ICD-10-CM

## 2022-09-08 DIAGNOSIS — W010XXA Fall on same level from slipping, tripping and stumbling without subsequent striking against object, initial encounter: Secondary | ICD-10-CM | POA: Diagnosis not present

## 2022-09-08 DIAGNOSIS — W19XXXA Unspecified fall, initial encounter: Secondary | ICD-10-CM

## 2022-09-08 LAB — CALR +MPL + E12-E15  (REFLEX)

## 2022-09-08 LAB — JAK2 V617F RFX CALR/MPL/E12-15

## 2022-09-08 NOTE — Progress Notes (Signed)
Your xrays did not show evidence of acute rib fracture or dislocation and the thoracic portion of your back was also normal in appearance. It is likely that your pain is probably from some deep bruising and the trauma from the fall. Please make sure you are still taking deep breaths regularly throughout the day and staying active as tolerated. You can alternate Tylenol and Ibuprofen for pain. Let us know if you have further questions or concerns

## 2022-09-08 NOTE — Progress Notes (Signed)
Acute Office Visit   Patient: Ellen May   DOB: 26-Feb-1971   52 y.o. Female  MRN: [id number] Visit Date: 09/08/2022  Today's healthcare provider: Dani Gobble Melodye Swor, PA-C  Introduced myself to the patient as a Journalist, newspaper and provided education on APPs in clinical practice.    Chief Complaint  Patient presents with   Back Pain   Fall    Patient says she fell on Wednesday and says she has not felt any better since. Patient is wanting to discuss possibly having a X-ray with provider at today's visit. Patient has tried Ibuprofen for the pain.    Subjective    Back Pain  Fall   HPI     Fall    Additional comments: Patient says she fell on Wednesday and says she has not felt any better since. Patient is wanting to discuss possibly having a X-ray with provider at today's visit. Patient has tried Ibuprofen for the pain.       Last edited by Irena Reichmann, CMA on 09/08/2022  8:27 AM.       Back pain  Onset: sudden after falling on Wed  Was trying to rise from a bench and her legs got twisted under her - she fell from standing to floor onto her right side Duration: fell on Wed 09/06/22  She denies hitting head or LOC  She reports most of pain is in her right side along her ribs She has some pain in her shoulder but this is not very significant per her words Location: Right thoracic back Radiation: Feels like it has spread more to lateral ribs  Associated symptoms: denies SOB but reports a deep breath is uncomfortable  Pain level and character: 5/10 right now, at its worse it will make her cry, can be sharp with deeper breaths, right now it is a dull pain   Interventions: has tried to stay mobile and active, Ibuprofen  Alleviating: getting into comfortable position  Aggravating: Stretching across her body to the left, area is tender to palpation        Medications: Outpatient Medications Prior to Visit  Medication Sig   atorvastatin (LIPITOR) 20 MG tablet Take  20 mg by mouth daily.   empagliflozin (JARDIANCE) 25 MG TABS tablet Take 1 tablet (25 mg total) by mouth daily.   insulin degludec (TRESIBA FLEXTOUCH) 200 UNIT/ML FlexTouch Pen Inject 34 Units into the skin daily.   Lancets (ONETOUCH DELICA PLUS [id number]) MISC    lisinopril (ZESTRIL) 20 MG tablet Take 20 mg by mouth daily.   metFORMIN (GLUCOPHAGE-XR) 500 MG 24 hr tablet Take 2 tablets (1,000 mg total) by mouth daily.   ONE TOUCH ULTRA TEST test strip    Semaglutide 0.25 or 0.5 MG/DOSE SOPN Inject 1 mg into the skin once a week. Friday   Semaglutide, 1 MG/DOSE, (OZEMPIC, 1 MG/DOSE,) 2 MG/1.5ML SOPN Inject 1 mg into the skin once a week.   scopolamine (TRANSDERM-SCOP) 1 MG/3DAYS Place 1 patch (1.5 mg total) onto the skin every 3 (three) days. (Patient not taking: Reported on 08/31/2022)   No facility-administered medications prior to visit.    Review of Systems  Musculoskeletal:  Positive for back pain and myalgias.       Right side and rib pain         Objective    BP 116/74   Pulse 93   Temp 98.3 F (36.8 C) (Oral)   Ht 5' 5"$  (1.651  m)   Wt 147 lb 4.8 oz (66.8 kg)   SpO2 98%   BMI 24.51 kg/m    Physical Exam Vitals reviewed.  Constitutional:      General: She is awake.     Appearance: Normal appearance. She is well-developed and well-groomed.  HENT:     Head: Normocephalic and atraumatic.  Eyes:     Extraocular Movements: Extraocular movements intact.     Conjunctiva/sclera: Conjunctivae normal.  Pulmonary:     Effort: Pulmonary effort is normal.  Musculoskeletal:        General: Normal range of motion.     Cervical back: Normal range of motion and neck supple.  Skin:    General: Skin is warm and dry.  Neurological:     General: No focal deficit present.     Mental Status: She is alert and oriented to person, place, and time. Mental status is at baseline.  Psychiatric:        Mood and Affect: Mood normal.        Behavior: Behavior normal. Behavior is  cooperative.        Thought Content: Thought content normal.        Judgment: Judgment normal.       No results found for any visits on 09/08/22.  Assessment & Plan      No follow-ups on file.      Problem List Items Addressed This Visit   None Visit Diagnoses     Rib pain on right side    -  Primary Acute, new concern Reports she fell on Wed and has had subsequent pain along the right side of her ribcage and into right thoracic area of back Xrays were ordered of right ribcage and thoracic spine- no acute injury, fracture or dislocation visualized on these images  Suspect pain is likely due to trauma from fall - cannot rule out muscular injury, deep bruising or strain at this time  Recommend she alternate Tylenol and Ibuprofen for pain management  Reviewed making sure she is taking deep breaths throughout the day and staying active as tolerated to prevent stiffness Follow up as needed for persistent or progressing symptoms     Relevant Orders   DG Thoracic Spine W/Swimmers (Completed)   DG Ribs Unilateral Right (Completed)   Fall as cause of accidental injury in home as place of occurrence, initial encounter     Acute, new concern Patient reports fall on Wed  She denies LOC or hitting head, nausea, confusion or decreased ROM, numbness at this time  She reports pain in right side addressed above.     Relevant Orders   DG Thoracic Spine W/Swimmers (Completed)   DG Ribs Unilateral Right (Completed)        No follow-ups on file.   I, Hubbert Landrigan E Bernard Slayden, PA-C, have reviewed all documentation for this visit. The documentation on 09/08/22 for the exam, diagnosis, procedures, and orders are all accurate and complete.   Talitha Givens, MHS, PA-C [facility]

## 2022-09-14 ENCOUNTER — Telehealth: Payer: Self-pay

## 2022-09-14 DIAGNOSIS — D751 Secondary polycythemia: Secondary | ICD-10-CM

## 2022-09-14 NOTE — Telephone Encounter (Signed)
-----   Message from Earlie Server, MD sent at 09/14/2022 10:17 AM EST ----- Work up results are good. No obvious signs of bone marrow overproducing.  Recommend patient to follow up in 6 months, labs prior to MD +/- venofer Check cbc iron tibc ferritin  thx

## 2022-09-14 NOTE — Telephone Encounter (Signed)
Called pt, no answer. Detailed message left and Mychart message left.    Please schedule pt for labs in 6 months (cbc,iron,ferr) MD/ venofer 1-2 days after labs.

## 2022-09-15 ENCOUNTER — Encounter: Payer: Self-pay | Admitting: Oncology

## 2022-12-11 LAB — HEMOGLOBIN A1C: Hemoglobin A1C: 8.7

## 2022-12-26 ENCOUNTER — Encounter: Payer: Self-pay | Admitting: Nurse Practitioner

## 2022-12-26 ENCOUNTER — Ambulatory Visit
Admission: RE | Admit: 2022-12-26 | Discharge: 2022-12-26 | Disposition: A | Discharge status: Home / Self Care | Payer: BC Managed Care – PPO | Source: Home / Self Care | Attending: Nurse Practitioner | Admitting: Nurse Practitioner

## 2022-12-26 ENCOUNTER — Ambulatory Visit
Admission: RE | Admit: 2022-12-26 | Discharge: 2022-12-26 | Disposition: A | Discharge status: Home / Self Care | Payer: BC Managed Care – PPO | Source: Ambulatory Visit | Attending: Nurse Practitioner | Admitting: Nurse Practitioner

## 2022-12-26 ENCOUNTER — Ambulatory Visit: Payer: BC Managed Care – PPO | Admitting: Nurse Practitioner

## 2022-12-26 VITALS — BP 137/85 | HR 93 | Temp 97.8°F | Wt 146.6 lb

## 2022-12-26 DIAGNOSIS — G8929 Other chronic pain: Secondary | ICD-10-CM | POA: Diagnosis present

## 2022-12-26 DIAGNOSIS — M25511 Pain in right shoulder: Secondary | ICD-10-CM

## 2022-12-26 DIAGNOSIS — E1165 Type 2 diabetes mellitus with hyperglycemia: Secondary | ICD-10-CM | POA: Diagnosis not present

## 2022-12-26 DIAGNOSIS — D500 Iron deficiency anemia secondary to blood loss (chronic): Secondary | ICD-10-CM

## 2022-12-26 DIAGNOSIS — I10 Essential (primary) hypertension: Secondary | ICD-10-CM | POA: Diagnosis not present

## 2022-12-26 DIAGNOSIS — E7800 Pure hypercholesterolemia, unspecified: Secondary | ICD-10-CM

## 2022-12-26 DIAGNOSIS — Z7985 Long-term (current) use of injectable non-insulin antidiabetic drugs: Secondary | ICD-10-CM

## 2022-12-26 LAB — MICROALBUMIN, URINE WAIVED
Creatinine, Urine Waived: 50 mg/dL (ref 10–300)
Microalb, Ur Waived: 30 mg/L — ABNORMAL HIGH (ref 0–19)

## 2022-12-26 MED ORDER — ATORVASTATIN CALCIUM 20 MG PO TABS: 20.0000 mg | ORAL_TABLET | Freq: Every day | ORAL | 1 refills | Status: AC

## 2022-12-26 MED ORDER — METFORMIN HCL ER 500 MG PO TB24: 1000.0000 mg | ORAL_TABLET | Freq: Every day | ORAL | 1 refills | Status: DC

## 2022-12-26 NOTE — Assessment & Plan Note (Signed)
Chronic.  Followed by Hematology.  Having some fatigue.  Will check CBC and Iron studies at visit today. Will make recommendations based on lab results.

## 2022-12-26 NOTE — Assessment & Plan Note (Signed)
Chronic.  Controlled.  Continue with current medication regimen of Lisinopril.  Labs ordered today.  Return to clinic in 6 months for reevaluation.  Call sooner if concerns arise.   

## 2022-12-26 NOTE — Assessment & Plan Note (Addendum)
Chronic. Not under good control with A1c of 8.2- following with endocrinology. Continue to follow with them. Call with any concerns. Now on Tresiba, Metformin, Jardiance and Ozempic.  Doing well with medications.  Follow up in 6 months.  Call sooner if concerns arise. - Needs updated eye exam

## 2022-12-26 NOTE — Progress Notes (Signed)
BP 137/85   Pulse 93   Temp 97.8 F (36.6 C) (Oral)   Wt 146 lb 9.6 oz (66.5 kg)   LMP  (LMP Unknown)   SpO2 99%   BMI 24.40 kg/m    Subjective:    Patient ID: Ellen May, female    DOB: Sep 08, 1970, 52 y.o.   MRN: [id number]  HPI: Ellen May is a 52 y.o. female  Chief Complaint  Patient presents with   Diabetes   Hypertension     HYPERTENSION Hypertension status: controlled Satisfied with current treatment? no Duration of hypertension: years BP monitoring frequency:   occasionally BP range: 117/80 BP medication side effects:  no Medication compliance: excellent compliance Previous BP meds:lisinopril Aspirin: no Recurrent headaches: no Visual changes: no Palpitations: no Dyspnea: no Chest pain: no Lower extremity edema: no Dizzy/lightheaded: no  DIABETES Patient is followed by Dr. Tedd Sias for diabetes.  Last A1c was 8.2%.  Patient is taking Metformin, Jardiance, Ozempic 2mg  and Tresiba 34u. Hypoglycemic episodes:no Polydipsia/polyuria: no Visual disturbance: no Chest pain: no Paresthesias: no Glucose Monitoring: yes  Accucheck frequency:   Fasting glucose: 160  Post prandial:  Evening:  Before meals: Taking Insulin?: yes  Long acting insulin: 34u  Short acting insulin: Blood Pressure Monitoring: daily Retinal Examination:  requested from [facility] Foot Exam: Up to Date Diabetic Education: Not Completed Pneumovax: Up to Date Influenza: Up to Date Aspirin: no  ANEMIA Scheduled for an iron infusion in August.  Would like her iron checked at visit today.  Anemia status: controlled Etiology of anemia: IDA Duration of anemia treatment:  Compliance with treatment: excellent compliance Iron supplementation side effects: yes Severity of anemia: mild Fatigue: yes Decreased exercise tolerance: no  Dyspnea on exertion: no Palpitations: no Bleeding: no Pica: no  Patient states she had a fall last year and hit her shoulder.  It has  been fine but more recently she has not been able to raise it as high as her other arm and at night it falls asleep frequently.  Relevant past medical, surgical, family and social history reviewed and updated as indicated. Interim medical history since our last visit reviewed. Allergies and medications reviewed and updated.  Review of Systems  Constitutional:  Positive for fatigue.  Eyes:  Negative for visual disturbance.  Respiratory:  Negative for cough, chest tightness and shortness of breath.   Cardiovascular:  Negative for chest pain, palpitations and leg swelling.  Endocrine: Negative for polydipsia and polyuria.  Musculoskeletal:        Right shoulder pain  Neurological:  Negative for dizziness, numbness and headaches.    Per HPI unless specifically indicated above     Objective:    BP 137/85   Pulse 93   Temp 97.8 F (36.6 C) (Oral)   Wt 146 lb 9.6 oz (66.5 kg)   LMP  (LMP Unknown)   SpO2 99%   BMI 24.40 kg/m   Wt Readings from Last 3 Encounters:  12/26/22 146 lb 9.6 oz (66.5 kg)  09/08/22 147 lb 4.8 oz (66.8 kg)  06/26/22 149 lb 3.2 oz (67.7 kg)    Physical Exam Vitals and nursing note reviewed.  Constitutional:      General: She is not in acute distress.    Appearance: Normal appearance. She is normal weight. She is not ill-appearing, toxic-appearing or diaphoretic.  HENT:     Head: Normocephalic.     Right Ear: External ear normal.     Left Ear: External ear  normal.     Nose: Nose normal.     Mouth/Throat:     Mouth: Mucous membranes are moist.     Pharynx: Oropharynx is clear.  Eyes:     General:        Right eye: No discharge.        Left eye: No discharge.     Extraocular Movements: Extraocular movements intact.     Conjunctiva/sclera: Conjunctivae normal.     Pupils: Pupils are equal, round, and reactive to light.  Cardiovascular:     Rate and Rhythm: Normal rate and regular rhythm.     Heart sounds: No murmur heard. Pulmonary:     Effort:  Pulmonary effort is normal. No respiratory distress.     Breath sounds: Normal breath sounds. No wheezing or rales.  Musculoskeletal:     Right shoulder: Decreased range of motion.     Cervical back: Normal range of motion and neck supple.  Skin:    General: Skin is warm and dry.     Capillary Refill: Capillary refill takes less than 2 seconds.  Neurological:     General: No focal deficit present.     Mental Status: She is alert and oriented to person, place, and time. Mental status is at baseline.  Psychiatric:        Mood and Affect: Mood normal.        Behavior: Behavior normal.        Thought Content: Thought content normal.        Judgment: Judgment normal.     Results for orders placed or performed in visit on 12/26/22  Hemoglobin A1c  Result Value Ref Range   Hemoglobin A1C 8.7       Assessment & Plan:   Problem List Items Addressed This Visit       Cardiovascular and Mediastinum   Hypertension - Primary    Chronic.  Controlled.  Continue with current medication regimen of Lisinopril.  Labs ordered today.  Return to clinic in 6 months for reevaluation.  Call sooner if concerns arise.        Relevant Medications   atorvastatin (LIPITOR) 20 MG tablet   Other Relevant Orders   Comp Met (CMET)     Endocrine   Poorly controlled type 2 diabetes mellitus (HCC)    Chronic. Not under good control with A1c of 8.2- following with endocrinology. Continue to follow with them. Call with any concerns. Now on Tresiba, Metformin, Jardiance and Ozempic.  Doing well with medications.  Follow up in 6 months.  Call sooner if concerns arise. - Needs updated eye exam      Relevant Medications   atorvastatin (LIPITOR) 20 MG tablet   metFORMIN (GLUCOPHAGE-XR) 500 MG 24 hr tablet   Other Relevant Orders   Microalbumin, Urine Waived     Other   Iron deficiency anemia (Chronic)    Chronic.  Followed by Hematology.  Having some fatigue.  Will check CBC and Iron studies at visit  today. Will make recommendations based on lab results.       Relevant Orders   CBC w/Diff   Iron, TIBC and Ferritin Panel   Hyperlipidemia    Chronic.  Controlled.  Continue with current medication regimen of Atorvastatin.  Labs ordered today.  Return to clinic in 6 months for reevaluation.  Call sooner if concerns arise.        Relevant Medications   atorvastatin (LIPITOR) 20 MG tablet   Other Visit Diagnoses  Chronic right shoulder pain       Will order xray.  Will make recommendations based on imaging results.   Relevant Orders   DG Shoulder Right        Follow up plan: Return in about 6 months (around 06/27/2023) for Physical and Fasting labs.

## 2022-12-26 NOTE — Assessment & Plan Note (Signed)
Chronic.  Controlled.  Continue with current medication regimen of Atorvastatin.  Labs ordered today.  Return to clinic in 6 months for reevaluation.  Call sooner if concerns arise.   

## 2022-12-27 ENCOUNTER — Encounter: Payer: Self-pay | Admitting: Nurse Practitioner

## 2022-12-27 DIAGNOSIS — G8929 Other chronic pain: Secondary | ICD-10-CM

## 2022-12-27 LAB — COMPREHENSIVE METABOLIC PANEL
ALT: 26 IU/L (ref 0–32)
AST: 21 IU/L (ref 0–40)
Albumin/Globulin Ratio: 1.8 (ref 1.2–2.2)
Albumin: 4.2 g/dL (ref 3.8–4.9)
Alkaline Phosphatase: 100 IU/L (ref 44–121)
BUN/Creatinine Ratio: 24 — ABNORMAL HIGH (ref 9–23)
BUN: 14 mg/dL (ref 6–24)
Bilirubin Total: 0.3 mg/dL (ref 0.0–1.2)
CO2: 23 mmol/L (ref 20–29)
Calcium: 9.4 mg/dL (ref 8.7–10.2)
Chloride: 102 mmol/L (ref 96–106)
Creatinine, Ser: 0.59 mg/dL (ref 0.57–1.00)
Globulin, Total: 2.3 g/dL (ref 1.5–4.5)
Glucose: 138 mg/dL — ABNORMAL HIGH (ref 70–99)
Potassium: 4.2 mmol/L (ref 3.5–5.2)
Sodium: 139 mmol/L (ref 134–144)
Total Protein: 6.5 g/dL (ref 6.0–8.5)
eGFR: 109 mL/min/{1.73_m2} (ref >59)

## 2022-12-27 LAB — CBC WITH DIFFERENTIAL/PLATELET
Basophils Absolute: 0.1 10*3/uL (ref 0.0–0.2)
Basos: 1 %
EOS (ABSOLUTE): 0.1 10*3/uL (ref 0.0–0.4)
Eos: 2 %
Hematocrit: 43.5 % (ref 34.0–46.6)
Hemoglobin: 13.9 g/dL (ref 11.1–15.9)
Immature Grans (Abs): 0 10*3/uL (ref 0.0–0.1)
Immature Granulocytes: 0 %
Lymphocytes Absolute: 2.7 10*3/uL (ref 0.7–3.1)
Lymphs: 39 %
MCH: 25.5 pg — ABNORMAL LOW (ref 26.6–33.0)
MCHC: 32 g/dL (ref 31.5–35.7)
MCV: 80 fL (ref 79–97)
Monocytes Absolute: 0.5 10*3/uL (ref 0.1–0.9)
Monocytes: 7 %
Neutrophils Absolute: 3.6 10*3/uL (ref 1.4–7.0)
Neutrophils: 51 %
Platelets: 295 10*3/uL (ref 150–450)
RBC: 5.46 x10E6/uL — ABNORMAL HIGH (ref 3.77–5.28)
RDW: 13.9 % (ref 11.7–15.4)
WBC: 6.9 10*3/uL (ref 3.4–10.8)

## 2022-12-27 LAB — IRON,TIBC AND FERRITIN PANEL
Ferritin: 36 ng/mL (ref 15–150)
Iron Saturation: 16 % (ref 15–55)
Iron: 51 ug/dL (ref 27–159)
Total Iron Binding Capacity: 325 ug/dL (ref 250–450)
UIBC: 274 ug/dL (ref 131–425)

## 2022-12-27 NOTE — Progress Notes (Signed)
Hi Ellen May.  Your xray shows that you have some tendinosis in the shoulder.  I recommend you see Orthopedics for further evaluation and management.  I can place a referral if you need one.

## 2022-12-27 NOTE — Progress Notes (Signed)
Hi Lyndsay. It was nice to see you yesterday.  Your lab work looks good.  Your iron studies are within normal range.  Keep your appt with the hematologist to discuss this further. No concerns at this time. Continue with your current medication regimen.  Follow up as discussed.  Please let me know if you have any questions.

## 2023-03-16 ENCOUNTER — Inpatient Hospital Stay: Payer: BC Managed Care – PPO

## 2023-03-16 ENCOUNTER — Inpatient Hospital Stay: Payer: BC Managed Care – PPO | Attending: Oncology

## 2023-03-16 DIAGNOSIS — D509 Iron deficiency anemia, unspecified: Secondary | ICD-10-CM | POA: Diagnosis present

## 2023-03-16 DIAGNOSIS — Z808 Family history of malignant neoplasm of other organs or systems: Secondary | ICD-10-CM | POA: Insufficient documentation

## 2023-03-16 DIAGNOSIS — Z8616 Personal history of COVID-19: Secondary | ICD-10-CM | POA: Diagnosis not present

## 2023-03-16 DIAGNOSIS — D751 Secondary polycythemia: Secondary | ICD-10-CM

## 2023-03-16 LAB — CBC WITH DIFFERENTIAL (CANCER CENTER ONLY)
Abs Immature Granulocytes: 0.02 10*3/uL (ref 0.00–0.07)
Basophils Absolute: 0.1 10*3/uL (ref 0.0–0.1)
Basophils Relative: 1 %
Eosinophils Absolute: 0.1 10*3/uL (ref 0.0–0.5)
Eosinophils Relative: 1 %
HCT: 42.2 % (ref 36.0–46.0)
Hemoglobin: 13.7 g/dL (ref 12.0–15.0)
Immature Granulocytes: 0 %
Lymphocytes Relative: 42 %
Lymphs Abs: 3 10*3/uL (ref 0.7–4.0)
MCH: 26.3 pg (ref 26.0–34.0)
MCHC: 32.5 g/dL (ref 30.0–36.0)
MCV: 81 fL (ref 80.0–100.0)
Monocytes Absolute: 0.5 10*3/uL (ref 0.1–1.0)
Monocytes Relative: 7 %
Neutro Abs: 3.5 10*3/uL (ref 1.7–7.7)
Neutrophils Relative %: 49 %
Platelet Count: 271 10*3/uL (ref 150–400)
RBC: 5.21 MIL/uL — ABNORMAL HIGH (ref 3.87–5.11)
RDW: 13.1 % (ref 11.5–15.5)
WBC Count: 7.1 10*3/uL (ref 4.0–10.5)
nRBC: 0 % (ref 0.0–0.2)

## 2023-03-16 LAB — IRON AND TIBC
Iron: 72 ug/dL (ref 28–170)
Saturation Ratios: 21 % (ref 10.4–31.8)
TIBC: 350 ug/dL (ref 250–450)
UIBC: 278 ug/dL

## 2023-03-16 LAB — FERRITIN: Ferritin: 17 ng/mL (ref 11–307)

## 2023-03-20 ENCOUNTER — Inpatient Hospital Stay (HOSPITAL_BASED_OUTPATIENT_CLINIC_OR_DEPARTMENT_OTHER): Payer: BC Managed Care – PPO | Admitting: Oncology

## 2023-03-20 ENCOUNTER — Encounter: Payer: Self-pay | Admitting: Oncology

## 2023-03-20 ENCOUNTER — Inpatient Hospital Stay: Payer: BC Managed Care – PPO

## 2023-03-20 VITALS — BP 120/84 | HR 106 | Temp 98.2°F | Resp 18 | Wt 146.1 lb

## 2023-03-20 DIAGNOSIS — D751 Secondary polycythemia: Secondary | ICD-10-CM

## 2023-03-20 DIAGNOSIS — D500 Iron deficiency anemia secondary to blood loss (chronic): Secondary | ICD-10-CM

## 2023-03-20 DIAGNOSIS — D509 Iron deficiency anemia, unspecified: Secondary | ICD-10-CM | POA: Diagnosis not present

## 2023-03-20 NOTE — Assessment & Plan Note (Signed)
Possible dysregulated iron metabolism due to erythrocytosis.  Lab Results  Component Value Date   HGB 13.7 03/16/2023   TIBC 350 03/16/2023   IRONPCTSAT 21 03/16/2023   FERRITIN 17 03/16/2023     No anemia, I will hold off iron supplementation.

## 2023-03-20 NOTE — Assessment & Plan Note (Addendum)
History of erythrocytosis, no smoking, negative sleep study Negative JAK2 V617F mutation negative, with reflex to other mutations CALR, MPL, JAK 2 Ex 12-15 mutations  Negative BCR ABL1 FISH Hemoglobin/Hct wnl

## 2023-03-20 NOTE — Progress Notes (Signed)
Hematology/Oncology Progress note [phone] [phone]   CHIEF COMPLAINTS/REASON FOR VISIT:  Evaluation of anemia  ASSESSMENT & PLAN:   Iron deficiency anemia Possible dysregulated iron metabolism due to erythrocytosis.  Lab Results  Component Value Date   HGB 13.7 03/16/2023   TIBC 350 03/16/2023   IRONPCTSAT 21 03/16/2023   FERRITIN 17 03/16/2023     No anemia, I will hold off iron supplementation.   Erythrocytosis History of erythrocytosis, no smoking, negative sleep study Negative JAK2 V617F mutation negative, with reflex to other mutations CALR, MPL, JAK 2 Ex 12-15 mutations  Negative BCR ABL1 FISH Hemoglobin/Hct wnl  Follow up PRN All questions were answered. The patient knows to call the clinic with any problems, questions or concerns.  Rickard Patience, MD, PhD [facility] Hematology Oncology 03/20/2023   HISTORY OF PRESENTING ILLNESS:  Ellen May is a  52 y.o.  female with PMH listed below who was referred to me for evaluation of anemia Previously received IV Venofer and tolerate well.  06/26/22 iron saturation 10,  hemoglobin 14.9 11/14/2021 Sleep study negative.   INTERVAL HISTORY Ellen May is a 52 y.o. female who has above history reviewed by me today presents for follow up visit for iron deficiency, erythrocytosis.  She reports feeling well.    Review of Systems  Constitutional:  Positive for fatigue. Negative for appetite change, chills and fever.  HENT:   Negative for hearing loss and voice change.   Eyes:  Negative for eye problems.  Respiratory:  Negative for chest tightness and cough.   Cardiovascular:  Negative for chest pain.  Gastrointestinal:  Negative for abdominal distention, abdominal pain and blood in stool.  Endocrine: Negative for hot flashes.  Genitourinary:  Positive for menstrual problem. Negative for difficulty urinating and frequency.   Musculoskeletal:  Negative for arthralgias.  Skin:  Negative for  itching and rash.  Neurological:  Negative for extremity weakness.  Hematological:  Negative for adenopathy.  Psychiatric/Behavioral:  Negative for confusion.      MEDICAL HISTORY:  Past Medical History:  Diagnosis Date   Anemia    COVID 2020   COVID 2021   cough, sinus trouble, body aches, low fever x 2 weeks all symptoms resolved   Dysplastic nevus 09/18/2019   Right lat hip above waistline. Mild atypia, limited margins free.   Hyperlipidemia    Hypertension    Situational anxiety    Type 2 dm     SURGICAL HISTORY: Past Surgical History:  Procedure Laterality Date   COLONOSCOPY WITH PROPOFOL N/A 01/04/2021   Procedure: COLONOSCOPY WITH PROPOFOL;  Surgeon: Midge Minium, MD;  Location[facility] ENDOSCOPY;  Service: Endoscopy;  Laterality: N/A;   DILATATION & CURETTAGE/HYSTEROSCOPY WITH MYOSURE N/A 03/23/2021   Procedure: DILATATION & CURETTAGE/HYSTEROSCOPY WITH MYOSURE;  Surgeon: Olivia Mackie, MD;  Location: [facility];  Service: Gynecology;  Laterality: N/A;   ESOPHAGOGASTRODUODENOSCOPY (EGD) WITH PROPOFOL N/A 01/04/2021   Procedure: ESOPHAGOGASTRODUODENOSCOPY (EGD) WITH PROPOFOL;  Surgeon: Midge Minium, MD;  Location: [facility] ENDOSCOPY;  Service: Endoscopy;  Laterality: N/A;   HYSTEROSCOPY WITH NOVASURE N/A 03/23/2021   Procedure: HYSTEROSCOPY WITH NOVASURE;  Surgeon: Olivia Mackie, MD;  Location: [facility];  Service: Gynecology;  Laterality: N/A;    SOCIAL HISTORY: Social History   Socioeconomic History   Marital status: Married    Spouse name: Not on file   Number of children: Not on file   Years of education: Not on file   Highest education level: Not  on file  Occupational History   Not on file  Tobacco Use   Smoking status: Never   Smokeless tobacco: Never  Vaping Use   Vaping status: Never Used  Substance and Sexual Activity   Alcohol use: No   Drug use: Never   Sexual activity: Yes  Other Topics Concern   Not on file   Social History Narrative   Not on file   Social Determinants of Health   Financial Resource Strain: Not on file  Food Insecurity: Not on file  Transportation Needs: Not on file  Physical Activity: Not on file  Stress: Not on file  Social Connections: Not on file  Intimate Partner Violence: Not on file    FAMILY HISTORY: Family History  Problem Relation Age of Onset   Diabetes Mother    Hypertension Mother    Skin cancer Mother    Heart disease Father    Alcohol abuse Father    Hypertension Father    Diabetes Sister    Arthritis Sister        RA    ALLERGIES:  is allergic to actos [pioglitazone].  MEDICATIONS:  Current Outpatient Medications  Medication Sig Dispense Refill   atorvastatin (LIPITOR) 20 MG tablet Take 1 tablet (20 mg total) by mouth daily. 90 tablet 1   empagliflozin (JARDIANCE) 25 MG TABS tablet Take 1 tablet (25 mg total) by mouth daily. 30 tablet 2   insulin degludec (TRESIBA FLEXTOUCH) 200 UNIT/ML FlexTouch Pen Inject 34 Units into the skin daily.     Lancets (ONETOUCH DELICA PLUS LANCET33G) MISC      lisinopril (ZESTRIL) 20 MG tablet Take 20 mg by mouth daily.     metFORMIN (GLUCOPHAGE-XR) 500 MG 24 hr tablet Take 2 tablets (1,000 mg total) by mouth daily. 180 tablet 1   ONE TOUCH ULTRA TEST test strip      scopolamine (TRANSDERM-SCOP) 1 MG/3DAYS Place 1 patch (1.5 mg total) onto the skin every 3 (three) days. 10 patch 1   Semaglutide, 1 MG/DOSE, (OZEMPIC, 1 MG/DOSE,) 2 MG/1.5ML SOPN Inject 1 mg into the skin once a week.     Semaglutide 0.25 or 0.5 MG/DOSE SOPN Inject 1 mg into the skin once a week. Friday (Patient not taking: Reported on 03/20/2023)     No current facility-administered medications for this visit.     PHYSICAL EXAMINATION: ECOG PERFORMANCE STATUS: 1 - Symptomatic but completely ambulatory Vitals:   03/20/23 1426  BP: 120/84  Pulse: (!) 106  Resp: 18  Temp: 98.2 F (36.8 C)   Filed Weights   03/20/23 1426  Weight: 146 lb  1.6 oz (66.3 kg)    Physical Exam Constitutional:      General: She is not in acute distress. HENT:     Head: Normocephalic and atraumatic.  Eyes:     General: No scleral icterus. Cardiovascular:     Rate and Rhythm: Normal rate and regular rhythm.     Heart sounds: Normal heart sounds.  Pulmonary:     Effort: Pulmonary effort is normal. No respiratory distress.     Breath sounds: No wheezing.  Abdominal:     General: Bowel sounds are normal. There is no distension.     Palpations: Abdomen is soft.  Musculoskeletal:        General: No deformity. Normal range of motion.     Cervical back: Normal range of motion and neck supple.  Skin:    General: Skin is warm and dry.  Findings: No erythema or rash.  Neurological:     Mental Status: She is alert and oriented to person, place, and time. Mental status is at baseline.     Cranial Nerves: No cranial nerve deficit.     Coordination: Coordination normal.  Psychiatric:        Mood and Affect: Mood normal.      LABORATORY DATA:  I have reviewed the data as listed    Latest Ref Rng & Units 03/16/2023    1:41 PM 12/26/2022    8:38 AM 08/31/2022    3:08 PM  CBC  WBC 4.0 - 10.5 K/uL 7.1  6.9  8.2   Hemoglobin 12.0 - 15.0 g/dL 16.1  09.6  04.5   Hematocrit 36.0 - 46.0 % 42.2  43.5  44.2   Platelets 150 - 400 K/uL 271  295  304       Latest Ref Rng & Units 12/26/2022    8:38 AM 08/31/2022    3:08 PM 06/26/2022    1:33 PM  CMP  Glucose 70 - 99 mg/dL 409  811  914   BUN 6 - 24 mg/dL 14  11  9    Creatinine 0.57 - 1.00 mg/dL 7.82  9.56  2.13   Sodium 134 - 144 mmol/L 139  139  140   Potassium 3.5 - 5.2 mmol/L 4.2  4.0  4.3   Chloride 96 - 106 mmol/L 102  106  105   CO2 20 - 29 mmol/L 23  24  18    Calcium 8.7 - 10.2 mg/dL 9.4  9.0  9.8   Total Protein 6.0 - 8.5 g/dL 6.5  7.4  6.9   Total Bilirubin 0.0 - 1.2 mg/dL 0.3  0.5  0.2   Alkaline Phos 44 - 121 IU/L 100  69  97   AST 0 - 40 IU/L 21  24  26    ALT 0 - 32 IU/L 26  28  36       Iron/TIBC/Ferritin/ %Sat    Component Value Date/Time   IRON 72 03/16/2023 1341   IRON 51 12/26/2022 0838   TIBC 350 03/16/2023 1341   TIBC 325 12/26/2022 0838   FERRITIN 17 03/16/2023 1341   FERRITIN 36 12/26/2022 0838   IRONPCTSAT 21 03/16/2023 1341   IRONPCTSAT 16 12/26/2022 0865

## 2023-05-17 ENCOUNTER — Ambulatory Visit: Payer: BC Managed Care – PPO | Admitting: Dermatology

## 2023-05-17 DIAGNOSIS — W908XXA Exposure to other nonionizing radiation, initial encounter: Secondary | ICD-10-CM

## 2023-05-17 DIAGNOSIS — D225 Melanocytic nevi of trunk: Secondary | ICD-10-CM | POA: Diagnosis not present

## 2023-05-17 DIAGNOSIS — D2271 Melanocytic nevi of right lower limb, including hip: Secondary | ICD-10-CM

## 2023-05-17 DIAGNOSIS — L821 Other seborrheic keratosis: Secondary | ICD-10-CM

## 2023-05-17 DIAGNOSIS — Z1283 Encounter for screening for malignant neoplasm of skin: Secondary | ICD-10-CM

## 2023-05-17 DIAGNOSIS — D492 Neoplasm of unspecified behavior of bone, soft tissue, and skin: Secondary | ICD-10-CM | POA: Diagnosis not present

## 2023-05-17 DIAGNOSIS — L578 Other skin changes due to chronic exposure to nonionizing radiation: Secondary | ICD-10-CM

## 2023-05-17 DIAGNOSIS — L814 Other melanin hyperpigmentation: Secondary | ICD-10-CM

## 2023-05-17 DIAGNOSIS — Z86018 Personal history of other benign neoplasm: Secondary | ICD-10-CM

## 2023-05-17 DIAGNOSIS — D489 Neoplasm of uncertain behavior, unspecified: Secondary | ICD-10-CM

## 2023-05-17 DIAGNOSIS — D229 Melanocytic nevi, unspecified: Secondary | ICD-10-CM

## 2023-05-17 NOTE — Patient Instructions (Addendum)
Biopsy Wound Care Instructions  Leave the original bandage on for 24 hours if possible.  If the bandage becomes soaked or soiled before that time, it is OK to remove it and examine the wound.  A small amount of post-operative bleeding is normal.  If excessive bleeding occurs, remove the bandage, place gauze over the site and apply continuous pressure (no peeking) over the area for 30 minutes. If this does not work, please call our clinic as soon as possible or page your doctor if it is after hours.   Once a day, cleanse the wound with soap and water. It is fine to shower. If a thick crust develops you may use a Q-tip dipped into dilute hydrogen peroxide (mix 1:1 with water) to dissolve it.  Hydrogen peroxide can slow the healing process, so use it only as needed.    After washing, apply petroleum jelly (Vaseline) or an antibiotic ointment if your doctor prescribed one for you, followed by a bandage.    For best healing, the wound should be covered with a layer of ointment at all times. If you are not able to keep the area covered with a bandage to hold the ointment in place, this may mean re-applying the ointment several times a day.  Continue this wound care until the wound has healed and is no longer open.   Itching and mild discomfort is normal during the healing process. However, if you develop pain or severe itching, please call our office.   If you have any discomfort, you can take Tylenol (acetaminophen) or ibuprofen as directed on the bottle. (Please do not take these if you have an allergy to them or cannot take them for another reason).  Some redness, tenderness and white or yellow material in the wound is normal healing.  If the area becomes very sore and red, or develops a thick yellow-green material (pus), it may be infected; please notify us.    If you have stitches, return to clinic as directed to have the stitches removed. You will continue wound care for 2-3 days after the stitches  are removed.   Wound healing continues for up to one year following surgery. It is not unusual to experience pain in the scar from time to time during the interval.  If the pain becomes severe or the scar thickens, you should notify the office.    A slight amount of redness in a scar is expected for the first six months.  After six months, the redness will fade and the scar will soften and fade.  The color difference becomes less noticeable with time.  If there are any problems, return for a post-op surgery check at your earliest convenience.  To improve the appearance of the scar, you can use silicone scar gel, cream, or sheets (such as Mederma or Serica) every night for up to one year. These are available over the counter (without a prescription).  Please call our office at [phone] for any questions or concerns.    To treat brown spots at face Discussed cosmetic procedure cryotherapy , noncovered.  $60 for 1st lesion and $15 for each additional lesion if done on the same day.  Maximum charge $350.  One touch-up treatment included no charge.  risks of treatment including dyspigmentation, small scar, and/or recurrence. Recommend daily broad spectrum sunscreen SPF 30+/photoprotection to treated areas once healed.     Melanoma ABCDEs  Melanoma is the most dangerous type of skin cancer, and is the leading  cause of death from skin disease.  You are more likely to develop melanoma if you: Have light-colored skin, light-colored eyes, or red or blond hair Spend a lot of time in the sun Tan regularly, either outdoors or in a tanning bed Have had blistering sunburns, especially during childhood Have a close family member who has had a melanoma Have atypical moles or large birthmarks  Early detection of melanoma is key since treatment is typically straightforward and cure rates are extremely high if we catch it early.   The first sign of melanoma is often a change in a mole or a new dark  spot.  The ABCDE system is a way of remembering the signs of melanoma.  A for asymmetry:  The two halves do not match. B for border:  The edges of the growth are irregular. C for color:  A mixture of colors are present instead of an even brown color. D for diameter:  Melanomas are usually (but not always) greater than 6mm - the size of a pencil eraser. E for evolution:  The spot keeps changing in size, shape, and color.  Please check your skin once per month between visits. You can use a small mirror in front and a large mirror behind you to keep an eye on the back side or your body.   If you see any new or changing lesions before your next follow-up, please call to schedule a visit.  Please continue daily skin protection including broad spectrum sunscreen SPF 30+ to sun-exposed areas, reapplying every 2 hours as needed when you're outdoors.   Staying in the shade or wearing long sleeves, sun glasses (UVA+UVB protection) and wide brim hats (4-inch brim around the entire circumference of the hat) are also recommended for sun protection.    Due to recent changes in healthcare laws, you may see results of your pathology and/or laboratory studies on MyChart before the doctors have had a chance to review them. We understand that in some cases there may be results that are confusing or concerning to you. Please understand that not all results are received at the same time and often the doctors may need to interpret multiple results in order to provide you with the best plan of care or course of treatment. Therefore, we ask that you please give [place] 2 business days to thoroughly review all your results before contacting the office for clarification. Should we see a critical lab result, you will be contacted sooner.   If You Need Anything After Your Visit  If you have any questions or concerns for your doctor, please call our main line at [phone] and press option 4 to reach your doctor's medical  assistant. If no one answers, please leave a voicemail as directed and we will return your call as soon as possible. Messages left after 4 pm will be answered the following business day.   You may also send [place] a message via MyChart. We typically respond to MyChart messages within 1-2 business days.  For prescription refills, please ask your pharmacy to contact our office. Our fax number is [phone].  If you have an urgent issue when the clinic is closed that cannot wait until the next business day, you can page your doctor at the number below.    Please note that while we do our best to be available for urgent issues outside of office hours, we are not available 24/7.   If you have an urgent issue and are unable  to reach [place], you may choose to seek medical care at your doctor's office, retail clinic, urgent care center, or emergency room.  If you have a medical emergency, please immediately call 911 or go to the emergency department.  Pager Numbers  - Dr. Gwen Pounds: [phone]  - Dr. Roseanne Reno: [phone]  - Dr. Katrinka Blazing: [phone]   In the event of inclement weather, please call our main line at [phone] for an update on the status of any delays or closures.  Dermatology Medication Tips: Please keep the boxes that topical medications come in in order to help keep track of the instructions about where and how to use these. Pharmacies typically print the medication instructions only on the boxes and not directly on the medication tubes.   If your medication is too expensive, please contact our office at [phone] option 4 or send [place] a message through MyChart.   We are unable to tell what your co-pay for medications will be in advance as this is different depending on your insurance coverage. However, we may be able to find a substitute medication at lower cost or fill out paperwork to get insurance to cover a needed medication.   If a prior authorization is required to get  your medication covered by your insurance company, please allow [place] 1-2 business days to complete this process.  Drug prices often vary depending on where the prescription is filled and some pharmacies may offer cheaper prices.  The website www.goodrx.com contains coupons for medications through different pharmacies. The prices here do not account for what the cost may be with help from insurance (it may be cheaper with your insurance), but the website can give you the price if you did not use any insurance.  - You can print the associated coupon and take it with your prescription to the pharmacy.  - You may also stop by our office during regular business hours and pick up a GoodRx coupon card.  - If you need your prescription sent electronically to a different pharmacy, notify our office through [facility] or by phone at [phone] option 4.     Si Usted Necesita Algo Despus de Su Visita  Tambin puede enviarnos un mensaje a travs de Clinical cytogeneticist. Por lo general respondemos a los mensajes de MyChart en el transcurso de 1 a 2 das hbiles.  Para renovar recetas, por favor pida a su farmacia que se ponga en contacto con nuestra oficina. Annie Sable de fax es [place] [phone].  Si tiene un asunto urgente cuando la clnica est cerrada y que no puede esperar hasta el siguiente da hbil, puede llamar/localizar a su doctor(a) al nmero que aparece a continuacin.   Por favor, tenga en cuenta que aunque hacemos todo lo posible para estar disponibles para asuntos urgentes fuera del horario de [place], no estamos disponibles las 24 horas del da, los 7 [address]  [address] de la [place].   Si tiene un problema urgente y no puede comunicarse con nosotros, puede optar por buscar atencin mdica  en el consultorio de su doctor(a), en una clnica privada, en un centro de atencin urgente o en una sala de emergencias.  Si tiene Engineer, drilling, por favor llame inmediatamente al 911 o vaya a la sala de  emergencias.  Nmeros de bper  - Dr. Gwen Pounds: [phone]  - Dra. Roseanne Reno: [phone]  - Dr. Katrinka Blazing: [phone]   En caso de inclemencias del tiempo, por favor llame a Lacy Duverney principal al [phone] para Neomia Dear actualizacin sobre el  estado de cualquier retraso o cierre.  Consejos para la medicacin en dermatologa: Por favor, guarde las cajas en las que vienen los medicamentos de uso tpico para ayudarle a seguir las instrucciones sobre dnde y cmo usarlos. Las farmacias generalmente imprimen las instrucciones del medicamento slo en las cajas y no directamente en los tubos del [place].   Si su medicamento es muy caro, por favor, pngase en contacto con Rolm Gala llamando al [phone] y presione la opcin 4 o envenos un mensaje a travs de Clinical cytogeneticist.   No podemos decirle cul ser su copago por los medicamentos por adelantado ya que esto es diferente dependiendo de la cobertura de su seguro. Sin embargo, es posible que podamos encontrar un medicamento sustituto a Audiological scientist un formulario para que el seguro cubra el medicamento que se considera necesario.   Si se requiere una autorizacin previa para que su compaa de seguros [place] su medicamento, por favor permtanos de 1 a 2 das hbiles para completar [address]  Los precios de los medicamentos varan con frecuencia dependiendo del Environmental consultant de dnde se surte la receta y alguna farmacias pueden ofrecer precios ms baratos.  El sitio web www.goodrx.com tiene cupones para medicamentos de Health and safety inspector. Los precios aqu no tienen en cuenta lo que podra costar con la ayuda del seguro (puede ser ms barato con su seguro), pero el sitio web puede darle el precio si no utiliz Tourist information centre manager.  - Puede imprimir el cupn correspondiente y llevarlo con su receta a la farmacia.  - Tambin puede pasar por nuestra oficina durante el horario de atencin regular y Education officer, museum una tarjeta de cupones de GoodRx.  - Si  necesita que su receta se enve electrnicamente a una farmacia diferente, informe a nuestra oficina a travs de MyChart de [place] o por telfono llamando al [phone] y presione la opcin 4.

## 2023-05-17 NOTE — Progress Notes (Signed)
Follow-Up Visit   Subjective  Ellen May is a 52 y.o. female who presents for the following: Skin Cancer Screening and Full Body Skin Exam hx of dysplastic nevus Skin care at face  The patient presents for Total-Body Skin Exam (TBSE) for skin cancer screening and mole check. The patient has spots, moles and lesions to be evaluated, some may be new or changing and the patient may have concern these could be cancer.   The following portions of the chart were reviewed this encounter and updated as appropriate: medications, allergies, medical history  Review of Systems:  No other skin or systemic complaints except as noted in HPI or Assessment and Plan.  Objective  Well appearing patient in no apparent distress; mood and affect are within normal limits.  A full examination was performed including scalp, head, eyes, ears, nose, lips, neck, chest, axillae, abdomen, back, buttocks, bilateral upper extremities, bilateral lower extremities, hands, feet, fingers, toes, fingernails, and toenails. All findings within normal limits unless otherwise noted below.   Relevant physical exam findings are noted in the Assessment and Plan.  left lateral waist/abdomen 0.7 x 0.3 cm irregular dark macule         Assessment & Plan   SKIN CANCER SCREENING PERFORMED TODAY.  ACTINIC DAMAGE - Chronic condition, secondary to cumulative UV/sun exposure - diffuse scaly erythematous macules with underlying dyspigmentation - Recommend daily broad spectrum sunscreen SPF 30+ to sun-exposed areas, reapply every 2 hours as needed.  - Staying in the shade or wearing long sleeves, sun glasses (UVA+UVB protection) and wide brim hats (4-inch brim around the entire circumference of the hat) are also recommended for sun protection.  - Call for new or changing lesions.  LENTIGINES, SEBORRHEIC KERATOSES, HEMANGIOMAS - Benign normal skin lesions - Benign-appearing - Call for any changes  Sks at face    Discussed brown spots at face  Discussed cosmetic procedure cryotherapy, noncovered.  $60 for 1st lesion and $15 for each additional lesion if done on the same day.  Maximum charge $350.  One touch-up treatment included no charge. Discussed risks of treatment including dyspigmentation, small scar, and/or recurrence. Recommend daily broad spectrum sunscreen SPF 30+/photoprotection to treated areas once healed.   MELANOCYTIC NEVI - Tan-brown and/or pink-flesh-colored symmetric macules and papules - Benign appearing on exam today - Observation - Call clinic for new or changing moles - Recommend daily use of broad spectrum spf 30+ sunscreen to sun-exposed areas.   Nevus   Right lower leg slightly irregular brown macule   Abdomen slightly irregular brown macule   Benign. Observe.  Will continue to watch and monitor.  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  Call if any new or changing lesions are noted between office visits  Neoplasm of uncertain behavior left lateral waist/abdomen  Epidermal / dermal shaving  Lesion diameter (cm):  0.7 Informed consent: discussed and consent obtained   Timeout: patient name, date of birth, surgical site, and procedure verified   Procedure prep:  Patient was prepped and draped in usual sterile fashion Prep type:  Isopropyl alcohol Anesthesia: the lesion was anesthetized in a standard fashion   Anesthetic:  1% lidocaine w/ epinephrine 1-100,000 buffered w/ 8.4% NaHCO3 Instrument used: flexible razor blade   Hemostasis achieved with: pressure,  aluminum chloride and electrodesiccation   Outcome: patient tolerated procedure well   Post-procedure details: sterile dressing applied and wound care instructions given    Dressing type: bandage and petrolatum    Specimen 1 - Surgical pathology Differential Diagnosis: irregular nevus r/o dysplasia   Check Margins: No  Irregular nevus r/o dysplasia    Return in about 1 year (around 05/16/2024) for TBSE.  IAsher Muir, CMA, am acting as scribe for Armida Sans, MD.   Documentation: I have reviewed the above documentation for accuracy and completeness, and I agree with the above.  Armida Sans, MD

## 2023-05-21 ENCOUNTER — Encounter: Payer: Self-pay | Admitting: Dermatology

## 2023-05-21 LAB — SURGICAL PATHOLOGY

## 2023-05-22 ENCOUNTER — Telehealth: Payer: Self-pay

## 2023-05-22 NOTE — Telephone Encounter (Signed)
-----   Message from Armida Sans sent at 05/21/2023  6:14 PM EDT ----- FINAL DIAGNOSIS        1. Skin, left lateral waist/abdomen :       DYSPLASTIC NEVUS WITH MODERATE TO SEVERE ATYPIA, PERIPHERAL MARGIN INVOLVED, SEE       DESCRIPTION   Severe dysplastic Schedule surgery

## 2023-05-22 NOTE — Telephone Encounter (Signed)
Patient informed of pathology results, and surgery scheduled.

## 2023-05-24 ENCOUNTER — Telehealth: Payer: Self-pay | Admitting: Nurse Practitioner

## 2023-05-24 DIAGNOSIS — R42 Dizziness and giddiness: Secondary | ICD-10-CM

## 2023-05-24 NOTE — Telephone Encounter (Signed)
Pt is calling in because she was referred to ENT for vertigo and was told by ENT to cancel appointment since it had gotten better. Pt says it has come back aggressively and she was told that [facility] could see her for it. Pt wants to know if Clydie Braun could give her a referral to [facility]. Please follow up with pt.

## 2023-05-25 ENCOUNTER — Telehealth: Payer: Self-pay | Admitting: Nurse Practitioner

## 2023-05-25 NOTE — Telephone Encounter (Signed)
Copied from CRM [id number]. Topic: Referral - Status >> May 25, 2023  9:38 AM Macon Large wrote: Reason for CRM: Pt called for an update on referral to [facility]. Please contact pt to advise.

## 2023-05-27 NOTE — Telephone Encounter (Signed)
Referral placed.

## 2023-05-29 ENCOUNTER — Telehealth: Payer: Self-pay

## 2023-05-29 ENCOUNTER — Ambulatory Visit: Payer: BC Managed Care – PPO | Admitting: Dermatology

## 2023-05-29 DIAGNOSIS — D239 Other benign neoplasm of skin, unspecified: Secondary | ICD-10-CM

## 2023-05-29 DIAGNOSIS — D235 Other benign neoplasm of skin of trunk: Secondary | ICD-10-CM

## 2023-05-29 MED ORDER — MUPIROCIN 2 % EX OINT: TOPICAL_OINTMENT | CUTANEOUS | 0 refills | Status: AC

## 2023-05-29 NOTE — Patient Instructions (Signed)

## 2023-05-29 NOTE — Telephone Encounter (Signed)
Patient doing well after today's surgery and had no questions or concerns at this time.

## 2023-05-29 NOTE — Progress Notes (Signed)
Follow-Up Visit   Subjective  Ellen May is a 52 y.o. female who presents for the following: bx proven moderate to severely dysplastic nevus of the L lat waist/abdomen, pt here today for excision.   The patient has spots, moles and lesions to be evaluated, some may be new or changing and the patient may have concern these could be cancer.   The following portions of the chart were reviewed this encounter and updated as appropriate: medications, allergies, medical history  Review of Systems:  No other skin or systemic complaints except as noted in HPI or Assessment and Plan.  Objective  Well appearing patient in no apparent distress; mood and affect are within normal limits.  A focused examination was performed of the following areas: the trunk   Relevant exam findings are noted in the Assessment and Plan.  L lat waist/abdomen Pink bx site 1.5 x 0.8 cm     Assessment & Plan     Dysplastic nevus L lat waist/abdomen  Moderate to severe dysplastic nevus bx proven, excised today Start Mupirocin oint qd  Skin excision - L lat waist/abdomen  Lesion length (cm):  1.5 Lesion width (cm):  0.8 Margin per side (cm):  0.2 Total excision diameter (cm):  1.9 Informed consent: discussed and consent obtained   Timeout: patient name, date of birth, surgical site, and procedure verified   Procedure prep:  Patient was prepped and draped in usual sterile fashion Prep type:  Isopropyl alcohol and povidone-iodine Anesthesia: the lesion was anesthetized in a standard fashion   Anesthesia comment:  Lidocaine 9 cc, bupivocaine 3 cc Anesthetic:  1% lidocaine w/ epinephrine 1-100,000 buffered w/ 8.4% NaHCO3 Instrument used: #15 blade   Hemostasis achieved with: pressure   Hemostasis achieved with comment:  Electrocautery Outcome: patient tolerated procedure well with no complications   Post-procedure details: sterile dressing applied and wound care instructions given   Dressing  type: bandage and pressure dressing    Skin repair - L lat waist/abdomen Complexity:  Complex Final length (cm):  3 Informed consent: discussed and consent obtained   Timeout: patient name, date of birth, surgical site, and procedure verified   Procedure prep:  Patient was prepped and draped in usual sterile fashion Prep type:  Povidone-iodine Anesthesia: the lesion was anesthetized in a standard fashion   Reason for type of repair: reduce tension to allow closure, reduce the risk of dehiscence, infection, and necrosis, reduce subcutaneous dead space and avoid a hematoma, allow closure of the large defect, preserve normal anatomy, preserve normal anatomical and functional relationships and enhance both functionality and cosmetic results   Undermining comment:  Undermining defect 1.9 cm Subcutaneous layers (deep stitches):  Suture size:  3-0 Suture type: Vicryl (polyglactin 910)   Fine/surface layer approximation (top stitches):  Suture size:  3-0 Stitches: simple running   Suture removal (days):  7 Hemostasis achieved with: suture and pressure Outcome: patient tolerated procedure well with no complications   Post-procedure details: sterile dressing applied and wound care instructions given   Dressing type: bandage and pressure dressing (Mupirocin 2% ointment)    Specimen 1 - Surgical pathology Differential Diagnosis: D48.5 bx proven moderate to severely dysplastic nevus  [id number] Check Margins: Yes    Return in about 1 week (around 06/05/2023) for suture removal.  I, Cari Caraway, CMA, am acting as scribe for Armida Sans, MD .   Documentation: I have reviewed the above documentation for accuracy and completeness, and I agree with the above.  Armida Sans, MD

## 2023-06-01 LAB — SURGICAL PATHOLOGY

## 2023-06-05 ENCOUNTER — Encounter: Payer: Self-pay | Admitting: Dermatology

## 2023-06-05 ENCOUNTER — Ambulatory Visit: Payer: BC Managed Care – PPO | Admitting: Dermatology

## 2023-06-05 DIAGNOSIS — D492 Neoplasm of unspecified behavior of bone, soft tissue, and skin: Secondary | ICD-10-CM

## 2023-06-05 DIAGNOSIS — D225 Melanocytic nevi of trunk: Secondary | ICD-10-CM | POA: Diagnosis not present

## 2023-06-05 DIAGNOSIS — D2271 Melanocytic nevi of right lower limb, including hip: Secondary | ICD-10-CM

## 2023-06-05 DIAGNOSIS — Z86018 Personal history of other benign neoplasm: Secondary | ICD-10-CM

## 2023-06-05 LAB — HM MAMMOGRAPHY

## 2023-06-05 NOTE — Progress Notes (Signed)
Follow-Up Visit   Subjective  Ellen May is a 52 y.o. female who presents for the following: Dysplastic Nevus margins free, bx proven, L lat waist/abdomen 1 wk s/p excision, pt presents for suture removal, Irregular nevi abdomen, R lower leg,  The patient has spots, moles and lesions to be evaluated, some may be new or changing and the patient may have concern these could be cancer.   The following portions of the chart were reviewed this encounter and updated as appropriate: medications, allergies, medical history  Review of Systems:  No other skin or systemic complaints except as noted in HPI or Assessment and Plan.  Objective  Well appearing patient in no apparent distress; mood and affect are within normal limits.   A focused examination was performed of the following areas: Abdomen, right leg  Relevant exam findings are noted in the Assessment and Plan.  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  - Discussed pathology results showing Severe dysplastic nevus margins free  - Patient advised to keep steri-strips dry until they fall off. - Scars remodel for a full year. - Once steri-strips fall off, patient can apply over-the-counter silicone scar cream each night to help with scar remodeling if desired. - Patient advised to call with any concerns or if they notice any new or changing lesions.   Neoplasm of skin (2) RLQA periumbilical  Epidermal / dermal shaving  Lesion diameter (cm):  0.7 Informed consent: discussed and consent obtained   Timeout: patient name,  date of birth, surgical site, and procedure verified   Procedure prep:  Patient was prepped and draped in usual sterile fashion Prep type:  Isopropyl alcohol Anesthesia: the lesion was anesthetized in a standard fashion   Anesthetic:  1% lidocaine w/ epinephrine 1-100,000 buffered w/ 8.4% NaHCO3 Instrument used: flexible razor blade   Hemostasis achieved with: pressure, aluminum chloride and electrodesiccation   Outcome: patient tolerated procedure well   Post-procedure details: sterile dressing applied and wound care instructions given   Dressing type: bandage and petrolatum    Specimen 1 - Surgical pathology Differential Diagnosis: D48.5 Nevus vs Dysplastic Nevus  Check Margins: yes Irregular brown macule 0.7cm  R mid medial pretibia  Epidermal / dermal shaving  Lesion diameter (cm):  0.4 Informed consent: discussed and consent obtained   Timeout: patient name, date of birth, surgical site, and procedure verified   Procedure prep:  Patient was prepped and draped in usual sterile fashion Prep type:  Isopropyl alcohol Anesthesia: the lesion was anesthetized in a standard fashion   Anesthetic:  1% lidocaine w/ epinephrine 1-100,000 buffered w/ 8.4% NaHCO3 Instrument used: flexible razor blade   Hemostasis achieved with: pressure, aluminum chloride and electrodesiccation   Outcome: patient tolerated procedure well   Post-procedure details: sterile dressing applied and wound care instructions given   Dressing type: bandage and petrolatum    Specimen 2 - Surgical pathology Differential Diagnosis: D48.5 Nevus vs Dysplastic Nevus  Check Margins: yes Irregular brown macule 0.4cm    Return for as scheduled for TBSE, Hx of Dysplastic nevi.  I, Lamar Laundry  Alzora Ha, RMA, am acting as scribe for Armida Sans, MD .   Documentation: I have reviewed the above documentation for accuracy and completeness, and I agree with the above.  Armida Sans, MD

## 2023-06-05 NOTE — Patient Instructions (Signed)
Wound Care Instructions  Cleanse wound gently with soap and water once a day then pat dry with clean gauze. Apply a thin coat of Petrolatum (petroleum jelly, "Vaseline") over the wound (unless you have an allergy to this). We recommend that you use a new, sterile tube of Vaseline. Do not pick or remove scabs. Do not remove the yellow or white "healing tissue" from the base of the wound.  Cover the wound with fresh, clean, nonstick gauze and secure with paper tape. You may use Band-Aids in place of gauze and tape if the wound is small enough, but would recommend trimming much of the tape off as there is often too much. Sometimes Band-Aids can irritate the skin.  You should call the office for your biopsy report after 1 week if you have not already been contacted.  If you experience any problems, such as abnormal amounts of bleeding, swelling, significant bruising, significant pain, or evidence of infection, please call the office immediately.  FOR ADULT SURGERY PATIENTS: If you need something for pain relief you may take 1 extra strength Tylenol (acetaminophen) AND 2 Ibuprofen (200mg each) together every 4 hours as needed for pain. (do not take these if you are allergic to them or if you have a reason you should not take them.) Typically, you may only need pain medication for 1 to 3 days.    

## 2023-06-07 LAB — SURGICAL PATHOLOGY

## 2023-06-11 ENCOUNTER — Telehealth: Payer: Self-pay

## 2023-06-11 NOTE — Telephone Encounter (Addendum)
Called patient regarding results. Verbalized understanding. Patient will continue to watch areas. Will call if she notices any changes before her next follow up in October 2025.   ----- Message from Armida Sans sent at 06/09/2023  4:18 PM EST ----- FINAL DIAGNOSIS        1. Skin, RLQA periumbilical :       DYSPLASTIC COMPOUND NEVUS WITH MODERATE ATYPIA, CLOSE TO MARGIN        2. Skin, R mid medial pretibia :       DYSPLASTIC COMPOUND NEVUS WITH MODERATE ATYPIA, CLOSE TO MARGIN   1&2 - both Moderate dysplastic Recheck next visit

## 2023-06-14 IMAGING — DX DG CHEST 2V
3 series · 3 of 3 positions shown · non-contrast
Comparison: None.

CLINICAL DATA: Cough.

EXAM:
CHEST - 2 VIEW

[chest pa (1 of 2)]
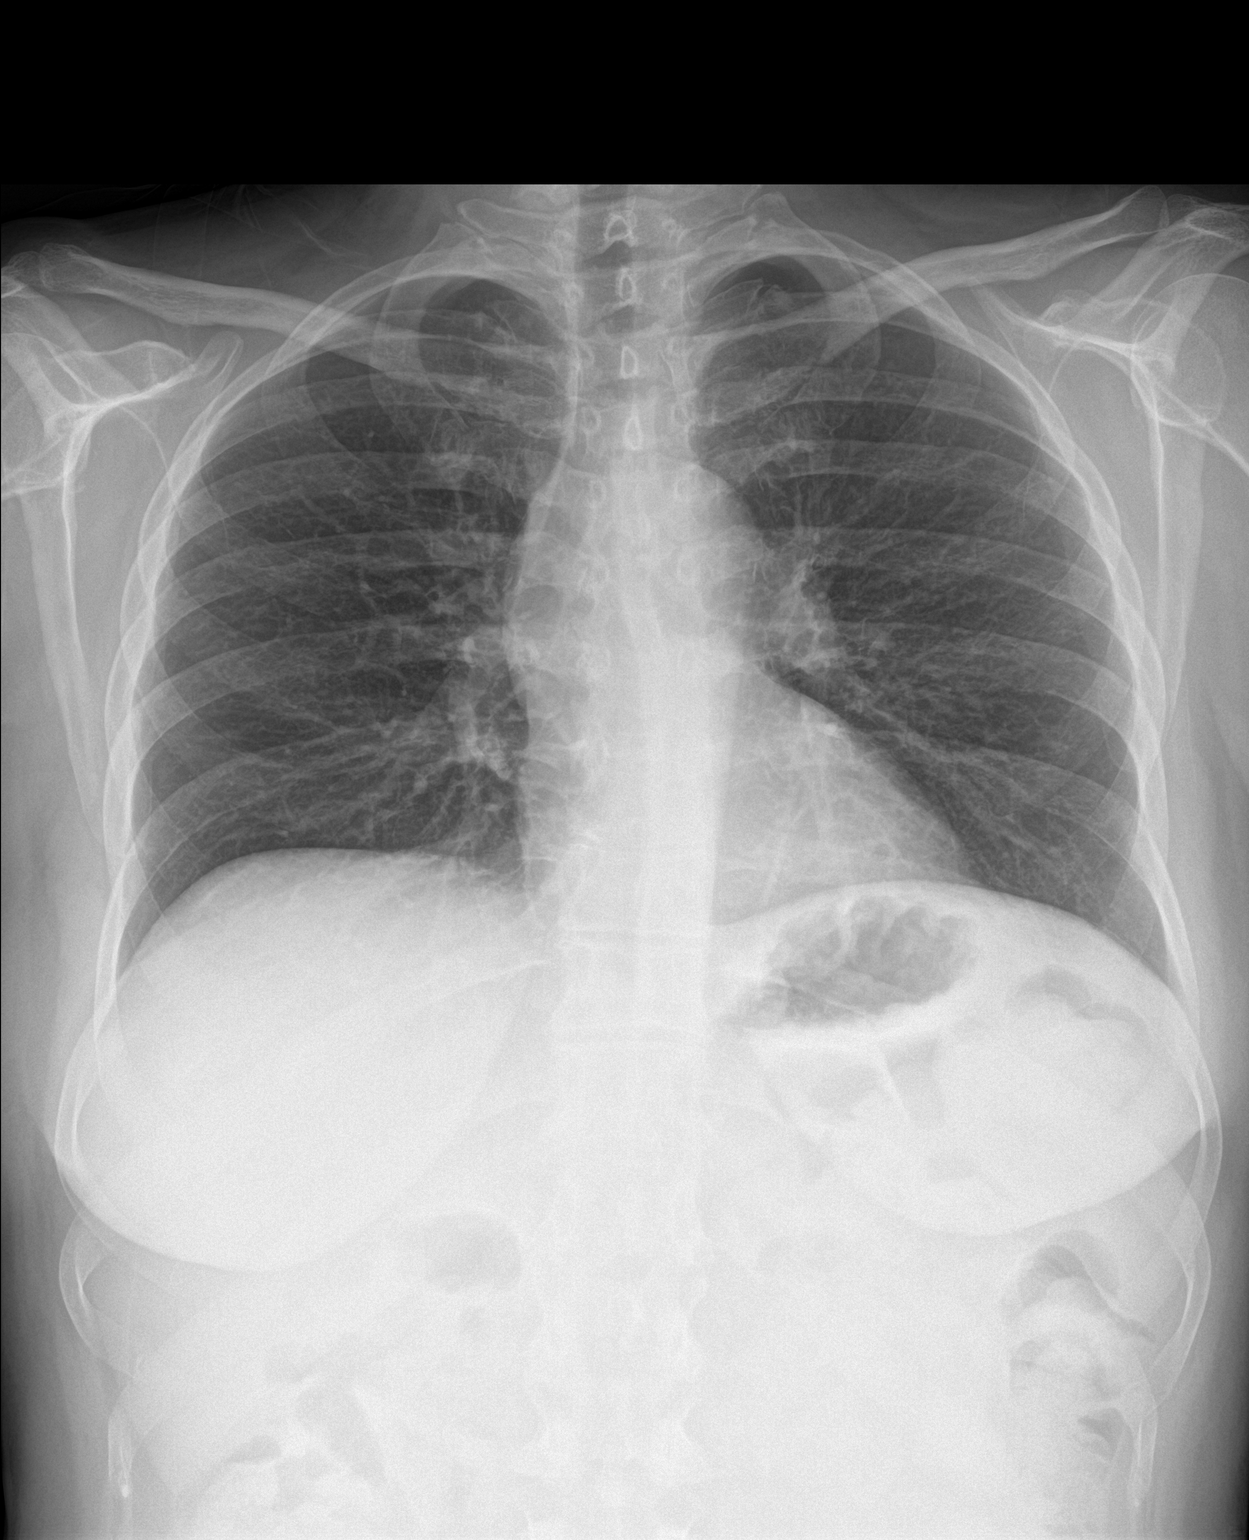

[chest lat]
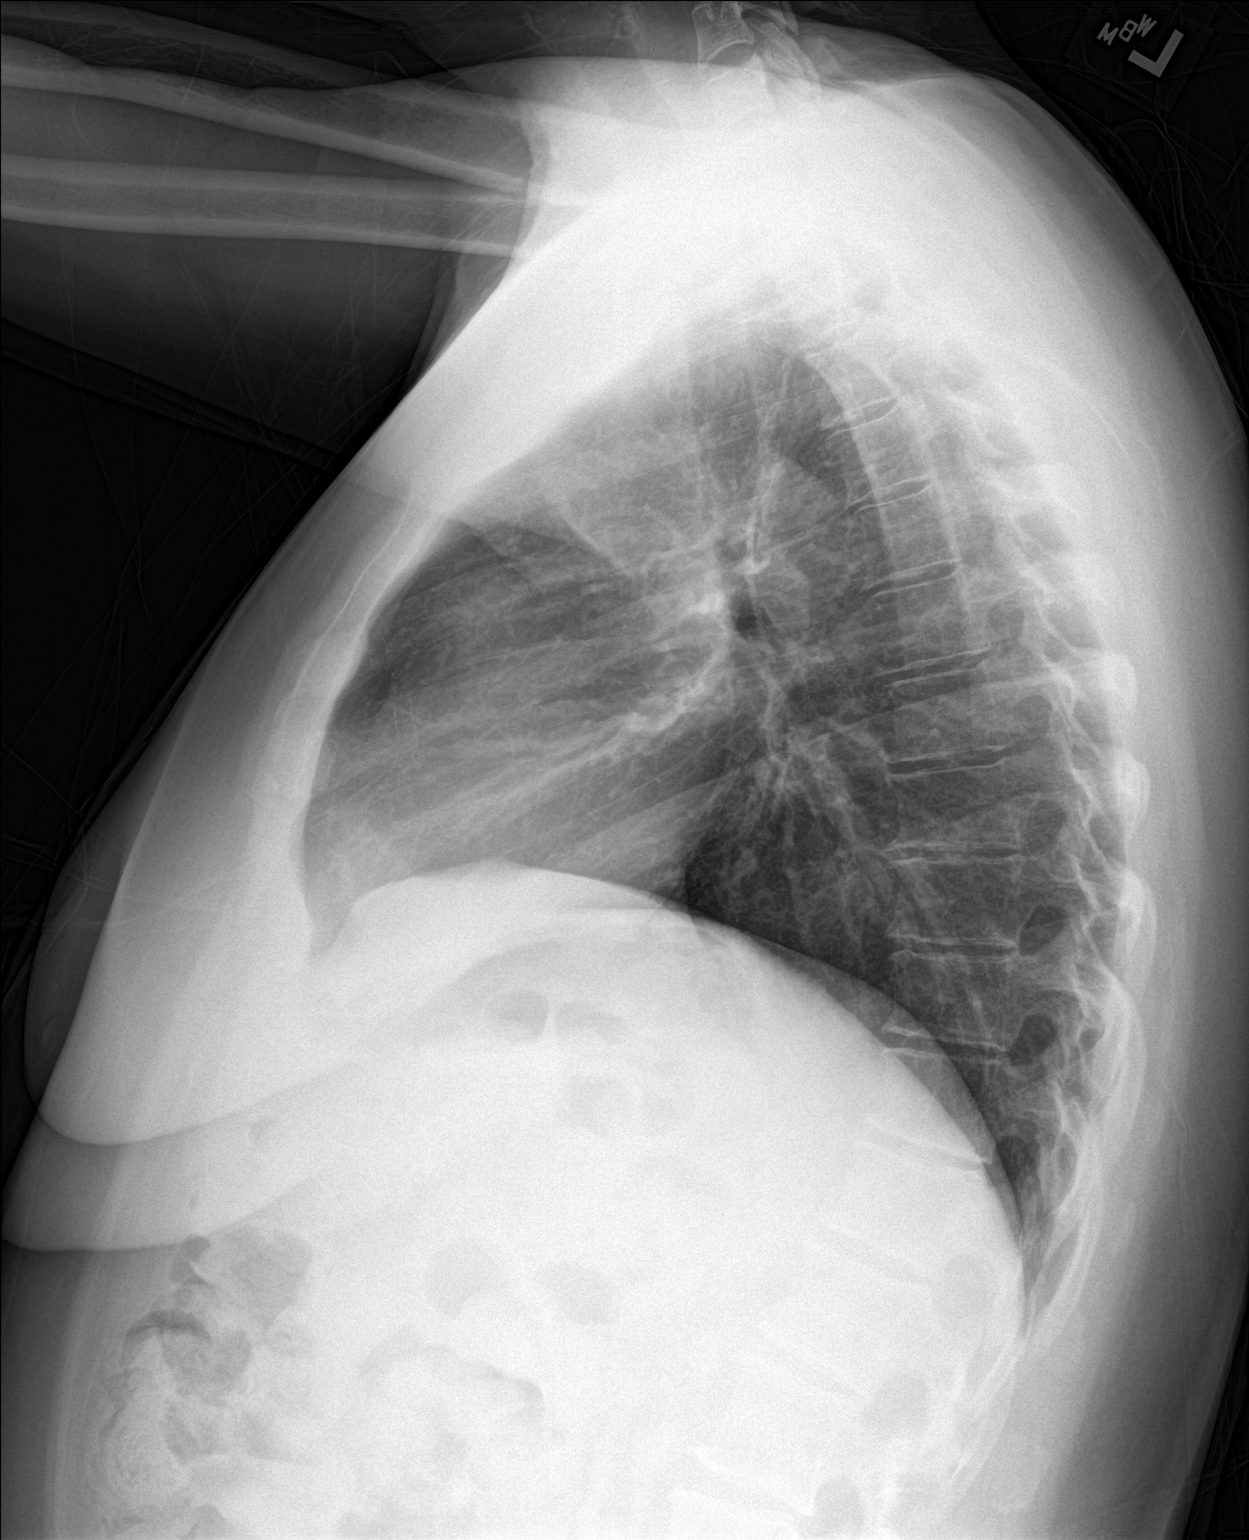

[chest pa (2 of 2)]
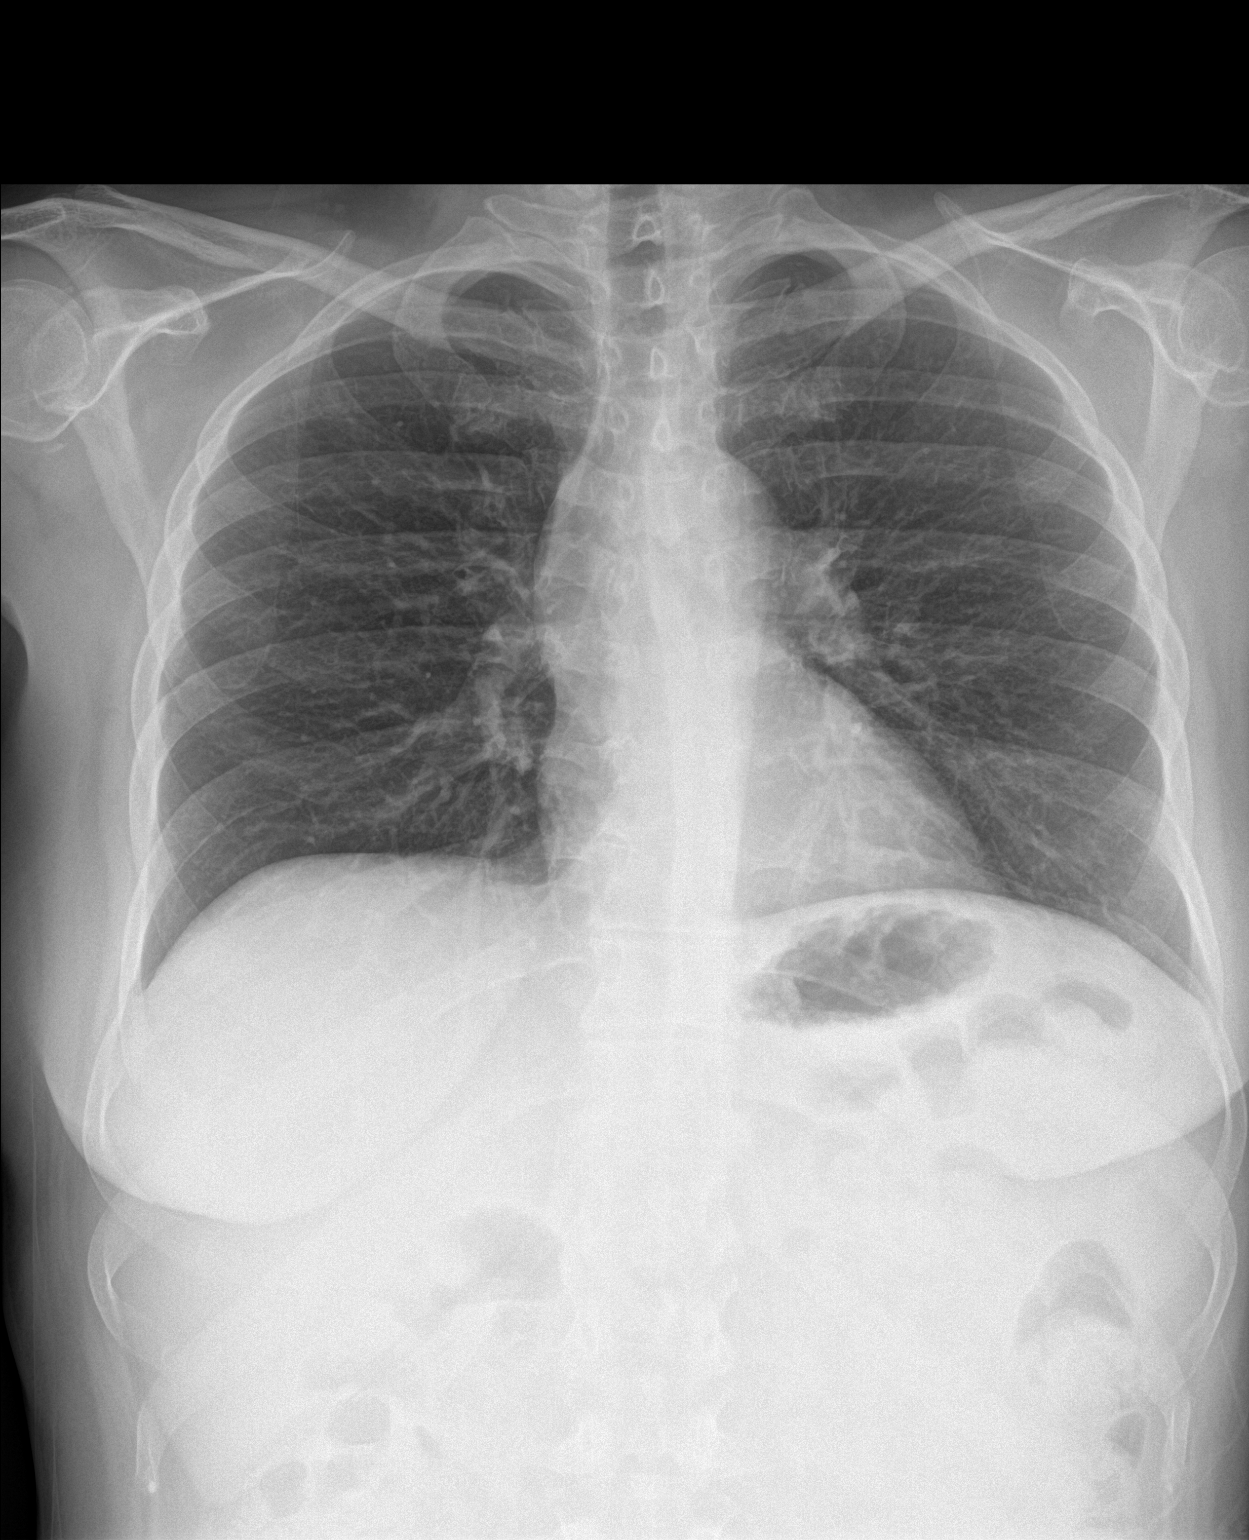

[3 of 3 positions shown; findings below may reference images not displayed]

FINDINGS: The heart size and mediastinal contours are within normal limits.
There is no evidence of pulmonary edema, consolidation,
pneumothorax, nodule or pleural fluid. The visualized skeletal
structures are unremarkable.
IMPRESSION: No active cardiopulmonary disease.

## 2023-06-19 ENCOUNTER — Ambulatory Visit: Payer: BC Managed Care – PPO | Admitting: Family Medicine

## 2023-06-19 ENCOUNTER — Ambulatory Visit: Payer: Self-pay | Admitting: *Deleted

## 2023-06-19 NOTE — Telephone Encounter (Signed)
  Chief Complaint: Cough, sore throat Symptoms: Dry cough, SOB with exertion, sore throat, voice hoarse. Frequency: Monday Pertinent Negatives: Patient denies fever Disposition: [] ED /[] Urgent Care (no appt availability in office) / [x] Appointment(In office/virtual)/ []  [facility]/ [] Home Care/ [] Refused Recommended Disposition /[] [facility]/ []  Follow-up with PCP Additional Notes:  Pt hard to understand due to hoarseness. Appt secured for today. Care advise provided, pt verbalizes understanding. Reason for Disposition  SEVERE (e.g., excruciating) throat pain  Answer Assessment - Initial Assessment Questions 1. ONSET: "When did the throat start hurting?" (Hours or days ago)      Monday 2. SEVERITY: "How bad is the sore throat?" (Scale 1-10; mild, moderate or severe)   - MILD (1-3):  Doesn't interfere with eating or normal activities.   - MODERATE (4-7): Interferes with eating some solids and normal activities.   - SEVERE (8-10):  Excruciating pain, interferes with most normal activities.   - SEVERE WITH DYSPHAGIA (10): Can't swallow liquids, drooling.     Varies 3. STREP EXPOSURE: "Has there been any exposure to strep within the past week?" If Yes, ask: "What type of contact occurred?"      No 4.  VIRAL SYMPTOMS: "Are there any symptoms of a cold, such as a runny nose, cough, hoarse voice or red eyes?"      Dry cough 5. FEVER: "Do you have a fever?" If Yes, ask: "What is your temperature, how was it measured, and when did it start?"     no 6. PUS ON THE TONSILS: "Is there pus on the tonsils in the back of your throat?"     unsure 7. OTHER SYMPTOMS: "Do you have any other symptoms?" (e.g., difficulty breathing, headache, rash)     SOB with exertion  Protocols used: Sore Throat-A-AH

## 2023-06-29 ENCOUNTER — Encounter: Payer: BC Managed Care – PPO | Admitting: Nurse Practitioner

## 2023-07-10 ENCOUNTER — Ambulatory Visit: Payer: BC Managed Care – PPO | Admitting: Pediatrics

## 2023-07-23 ENCOUNTER — Encounter: Payer: Self-pay | Admitting: Oncology

## 2023-10-01 ENCOUNTER — Other Ambulatory Visit: Payer: Self-pay | Admitting: Nurse Practitioner

## 2023-10-02 NOTE — Telephone Encounter (Signed)
 Requested medications are due for refill today.  yes  Requested medications are on the active medications list.  yes  Last refill. 12/26/2022 #90 1 rf  Future visit scheduled.   no  Notes to clinic.  Labs are expired.    Requested Prescriptions  Pending Prescriptions Disp Refills   atorvastatin (LIPITOR) 20 MG tablet [Pharmacy Med Name: ATORVASTATIN 20 MG TABLET] 90 tablet 1    Sig: TAKE 1 TABLET BY MOUTH DAILY     Cardiovascular:  Antilipid - Statins Failed - 10/02/2023  2:15 PM      Failed - Lipid Panel in normal range within the last 12 months    Cholesterol, Total  Date Value Ref Range Status  06/26/2022 144 100 - 199 mg/dL Final   Cholesterol Piccolo, Waived  Date Value Ref Range Status  10/26/2015 151 <200 mg/dL Final    Comment:                            Desirable                <200                         Borderline High      200- 239                         High                     >239    LDL Chol Calc (NIH)  Date Value Ref Range Status  06/26/2022 86 0 - 99 mg/dL Final   HDL  Date Value Ref Range Status  06/26/2022 40 >39 mg/dL Final   Triglycerides  Date Value Ref Range Status  06/26/2022 95 0 - 149 mg/dL Final   Triglycerides Piccolo,Waived  Date Value Ref Range Status  10/26/2015 133 <150 mg/dL Final    Comment:                            Normal                   <150                         Borderline High     150 - 199                         High                200 - 499                         Very High                >499          Passed - Patient is not pregnant      Passed - Valid encounter within last 12 months    Recent Outpatient Visits           9 months ago Primary hypertension   [place] [facility] [place] Larae Grooms, NP   1 year ago Rib pain on right side   [place] [facility] Mecum, Oswaldo Conroy, PA-C   1 year ago Poorly controlled type  2 diabetes mellitus [facility])   [place] [facility] Larae Grooms, NP   1 year ago Urinary symptom or sign   [place] [facility] [place], Corrie Dandy T, NP   2 years ago Primary hypertension   [place] [facility] Larae Grooms, NP       Future Appointments             In 7 months Gwen Pounds Dineen Kid, MD [facility]

## 2023-12-10 ENCOUNTER — Ambulatory Visit: Admitting: Nurse Practitioner

## 2023-12-10 ENCOUNTER — Encounter: Payer: Self-pay | Admitting: Nurse Practitioner

## 2023-12-10 ENCOUNTER — Encounter: Payer: Self-pay | Admitting: Oncology

## 2023-12-10 ENCOUNTER — Ambulatory Visit: Payer: Self-pay | Admitting: Nurse Practitioner

## 2023-12-10 VITALS — BP 133/85 | HR 119 | Temp 98.1°F

## 2023-12-10 DIAGNOSIS — R509 Fever, unspecified: Secondary | ICD-10-CM

## 2023-12-10 MED ORDER — AMOXICILLIN 500 MG PO CAPS: 500.0000 mg | ORAL_CAPSULE | Freq: Two times a day (BID) | ORAL | 0 refills | Status: AC

## 2023-12-10 MED ORDER — HYDROCOD POLI-CHLORPHE POLI ER 10-8 MG/5ML PO SUER: 5.0000 mL | Freq: Every evening | ORAL | 0 refills | Status: DC | PRN

## 2023-12-10 MED ORDER — BENZONATATE 200 MG PO CAPS: 200.0000 mg | ORAL_CAPSULE | Freq: Two times a day (BID) | ORAL | 0 refills | Status: DC | PRN

## 2023-12-10 NOTE — Progress Notes (Signed)
 BP 133/85   Pulse (!) 119   Temp 98.1 F (36.7 C) (Oral)   SpO2 97%    Subjective:    Patient ID: Ellen May, female    DOB: 1971-07-21, 53 y.o.   MRN: [id number]  HPI: Ellen May is a 53 y.o. female  Chief Complaint  Patient presents with   Sore Throat   Cough   Fever    Highest fever yesterday 100.1   UPPER RESPIRATORY TRACT INFECTION Worst symptom: Symptoms started on friday Fever: yes Cough: yes Shortness of breath: yes Wheezing: no Chest pain: no Chest tightness: no Chest congestion: no Nasal congestion: yes Runny nose: yes Post nasal drip: yes Sneezing: yes Sore throat: yes Swollen glands: yes Sinus pressure: yes Headache: yes Face pain: no Toothache: no Ear pain: no bilateral Ear pressure:yes bilateral Eyes red/itching:no Eye drainage/crusting: no  Vomiting: no Rash: no Fatigue: yes Sick contacts: no Strep contacts: no  Context: stable Recurrent sinusitis: no Relief with OTC cold/cough medications: yes  Treatments attempted: ibuprofen    Relevant past medical, surgical, family and social history reviewed and updated as indicated. Interim medical history since our last visit reviewed. Allergies and medications reviewed and updated.  Review of Systems  Constitutional:  Positive for fatigue and fever.  HENT:  Positive for congestion, postnasal drip, rhinorrhea, sneezing and sore throat. Negative for dental problem, ear pain, sinus pressure and sinus pain.   Respiratory:  Positive for cough and shortness of breath. Negative for wheezing.   Cardiovascular:  Negative for chest pain.  Gastrointestinal:  Negative for vomiting.  Skin:  Negative for rash.  Neurological:  Positive for headaches.    Per HPI unless specifically indicated above     Objective:     BP 133/85   Pulse (!) 119   Temp 98.1 F (36.7 C) (Oral)   SpO2 97%   Wt Readings from Last 3 Encounters:  03/20/23 146 lb 1.6 oz (66.3 kg)  12/26/22 146 lb 9.6 oz (66.5  kg)  09/08/22 147 lb 4.8 oz (66.8 kg)    Physical Exam Vitals and nursing note reviewed.  Constitutional:      General: She is not in acute distress.    Appearance: Normal appearance. She is normal weight. She is ill-appearing. She is not toxic-appearing or diaphoretic.  HENT:     Head: Normocephalic.     Right Ear: Tympanic membrane and external ear normal.     Left Ear: Tympanic membrane and external ear normal.     Nose: Congestion and rhinorrhea present.     Mouth/Throat:     Mouth: Mucous membranes are moist.     Pharynx: Oropharyngeal exudate and posterior oropharyngeal erythema present.  Eyes:     General:        Right eye: No discharge.        Left eye: No discharge.     Extraocular Movements: Extraocular movements intact.     Conjunctiva/sclera: Conjunctivae normal.     Pupils: Pupils are equal, round, and reactive to light.  Cardiovascular:     Rate and Rhythm: Normal rate and regular rhythm.     Heart sounds: No murmur heard. Pulmonary:     Effort: Pulmonary effort is normal. No respiratory distress.     Breath sounds: Normal breath sounds. No wheezing or rales.  Musculoskeletal:     Cervical back: Normal range of motion and neck supple.  Skin:    General: Skin is warm and dry.  Capillary Refill: Capillary refill takes less than 2 seconds.  Neurological:     General: No focal deficit present.     Mental Status: She is alert and oriented to person, place, and time. Mental status is at baseline.  Psychiatric:        Mood and Affect: Mood normal.        Behavior: Behavior normal.        Thought Content: Thought content normal.        Judgment: Judgment normal.     Results for orders placed or performed in visit on 06/07/23  [facility] MAMMOGRAPHY   Collection Time: 06/05/23  8:53 AM  Result Value Ref Range   HM Mammogram 0-4 Bi-Rad 0-4 Bi-Rad, Self Reported Normal      Assessment & Plan:   Problem List Items Addressed This Visit   None Visit Diagnoses        Fever, unspecified fever cause    -  Primary   Flu and Strep neg in office. Will treat with Amoxicillin .  HR is 120s in the office.  COVID was sent out. Recommend resting and staying well hydrated.   Relevant Orders   Novel Coronavirus, NAA ([facility])   Rapid Strep screen([facility])   Influenza A & B (STAT)        Follow up plan: Return if symptoms worsen or fail to improve.

## 2023-12-12 LAB — VERITOR FLU A/B WAIVED
Influenza A: NEGATIVE
Influenza B: NEGATIVE

## 2023-12-12 LAB — CULTURE, GROUP A STREP: Strep A Culture: NEGATIVE

## 2023-12-12 LAB — NOVEL CORONAVIRUS, NAA: SARS-CoV-2, NAA: NOT DETECTED

## 2023-12-12 LAB — RAPID STREP SCREEN (MED CTR MEBANE ONLY): Strep Gp A Ag, IA W/Reflex: NEGATIVE

## 2023-12-20 ENCOUNTER — Encounter: Payer: Self-pay | Admitting: Nurse Practitioner

## 2024-01-31 ENCOUNTER — Encounter: Payer: Self-pay | Admitting: Nurse Practitioner

## 2024-01-31 ENCOUNTER — Ambulatory Visit: Admitting: Nurse Practitioner

## 2024-01-31 VITALS — BP 104/73 | HR 105 | Temp 98.5°F | Ht 65.0 in | Wt 143.2 lb

## 2024-01-31 DIAGNOSIS — Z0000 Encounter for general adult medical examination without abnormal findings: Secondary | ICD-10-CM | POA: Diagnosis not present

## 2024-01-31 DIAGNOSIS — I10 Essential (primary) hypertension: Secondary | ICD-10-CM | POA: Diagnosis not present

## 2024-01-31 DIAGNOSIS — D500 Iron deficiency anemia secondary to blood loss (chronic): Secondary | ICD-10-CM | POA: Diagnosis not present

## 2024-01-31 DIAGNOSIS — E1165 Type 2 diabetes mellitus with hyperglycemia: Secondary | ICD-10-CM

## 2024-01-31 DIAGNOSIS — E7800 Pure hypercholesterolemia, unspecified: Secondary | ICD-10-CM | POA: Diagnosis not present

## 2024-01-31 LAB — MICROALBUMIN, URINE WAIVED
Creatinine, Urine Waived: 50 mg/dL (ref 10–300)
Microalb, Ur Waived: 10 mg/L (ref 0–19)

## 2024-01-31 MED ORDER — METFORMIN HCL ER 500 MG PO TB24: 1000.0000 mg | ORAL_TABLET | Freq: Two times a day (BID) | ORAL | 1 refills | Status: AC

## 2024-01-31 MED ORDER — EMPAGLIFLOZIN 25 MG PO TABS: 25.0000 mg | ORAL_TABLET | Freq: Every day | ORAL | 1 refills | Status: AC

## 2024-01-31 MED ORDER — SCOPOLAMINE 1 MG/3DAYS TD PT72: 1.0000 | MEDICATED_PATCH | TRANSDERMAL | 1 refills | Status: AC

## 2024-01-31 NOTE — Assessment & Plan Note (Signed)
 Chronic. Not under good control with A1c of 7.7% - following with endocrinology. Continue to follow with them. Call with any concerns. Now on Tresiba, Metformin , Jardiance  and Ozempic.  Doing well with medications.  Follow up in 6 months.  Call sooner if concerns arise. - Recent eye exam was done at Dr. Brigid office.

## 2024-01-31 NOTE — Assessment & Plan Note (Signed)
Chronic.  Controlled.  Continue with current medication regimen of Lisinopril.  Labs ordered today.  Return to clinic in 6 months for reevaluation.  Call sooner if concerns arise.   

## 2024-01-31 NOTE — Assessment & Plan Note (Signed)
 Chronic.  Controlled.  Continue with current medication regimen of Atorvastatin.  Labs ordered today.  Return to clinic in 6 months for reevaluation.  Call sooner if concerns arise.

## 2024-01-31 NOTE — Assessment & Plan Note (Signed)
 Chronic.  Controlled.  Continue with current medication regimen.  Labs ordered today.  Return to clinic in 6 months for reevaluation.  Call sooner if concerns arise.  ? ?

## 2024-01-31 NOTE — Progress Notes (Signed)
 BP 104/73   Pulse (!) 105   Temp 98.5 F (36.9 C) (Oral)   Ht 5' 5 (1.651 m)   Wt 143 lb 3.2 oz (65 kg)   SpO2 98%   BMI 23.83 kg/m    Subjective:    Patient ID: Ellen May, female    DOB: 09/06/1970, 53 y.o.   MRN: [id number]  HPI: Ellen May is a 53 y.o. female presenting on 01/31/2024 for comprehensive medical examination. Current medical complaints include:none  She currently lives with: Menopausal Symptoms: no  HYPERTENSION Hypertension status: controlled Satisfied with current treatment? no Duration of hypertension: years BP monitoring frequency:  occasionally BP range: 117/80 BP medication side effects:  no Medication compliance: excellent compliance Previous BP meds:lisinopril  Aspirin: no Recurrent headaches: no Visual changes: no Palpitations: no Dyspnea: no Chest pain: no Lower extremity edema: no Dizzy/lightheaded: no  DIABETES Patient is followed by Dr. Damian for diabetes.  Last A1c was 7.7%.  Patient is taking Metformin , Jardiance , Ozempic 2mg  and Tresiba 34u. Hypoglycemic episodes:no Polydipsia/polyuria: no Visual disturbance: no Chest pain: no Paresthesias: no Glucose Monitoring: yes  Accucheck frequency:   Fasting glucose: 100-130  Post prandial:  Evening:  Before meals: Taking Insulin?: yes  Long acting insulin: 34u  Short acting insulin: Blood Pressure Monitoring: daily Retinal Examination: requested from [facility] Exam: Up to Date Diabetic Education: Not Completed Pneumovax: Up to Date Influenza: Up to Date Aspirin: no  ANEMIA  Would like her iron  checked at visit today.  Anemia status: controlled Etiology of anemia: IDA Duration of anemia treatment:  Compliance with treatment: excellent compliance Iron  supplementation side effects: yes Severity of anemia: mild Fatigue: yes Decreased exercise tolerance: no  Dyspnea on exertion: no Palpitations: no Bleeding: no Pica: no  Depression Screen done today  and results listed below:     12/10/2023    9:26 AM 12/26/2022    8:26 AM 09/08/2022    8:32 AM 06/26/2022    1:10 PM 12/06/2021    2:06 PM  Depression screen PHQ 2/9  Decreased Interest 0 0 0 0 0  Down, Depressed, Hopeless 0 0 0 0 0  PHQ - 2 Score 0 0 0 0 0  Altered sleeping 0 0 0 0 0  Tired, decreased energy 0 0 0 0 0  Change in appetite 0 0 0 0 0  Feeling bad or failure about yourself  0 0 0 0 0  Trouble concentrating 0 0 0 0 0  Moving slowly or fidgety/restless 0 0 0 0 0  Suicidal thoughts 0 0 0 0 0  PHQ-9 Score 0 0 0 0 0  Difficult doing work/chores Not difficult at all Not difficult at all Not difficult at all Not difficult at all     The patient does not have a history of falls. I did complete a risk assessment for falls. A plan of care for falls was documented.   Past Medical History:  Past Medical History:  Diagnosis Date   Anemia    COVID 2020   COVID 2021   cough, sinus trouble, body aches, low fever x 2 weeks all symptoms resolved   Dysplastic nevus 09/18/2019   Right lat hip above waistline. Mild atypia, limited margins free.   Dysplastic nevus 05/17/2023   L lat waist/abdomen - mod to severe, Excised 05/29/23   Dysplastic nevus 06/05/2023   RLQA periumbilical - moderate   Dysplastic nevus 06/05/2023   right mid medial pretibia moderate   Hyperlipidemia  Hypertension    Situational anxiety    Type 2 dm     Surgical History:  Past Surgical History:  Procedure Laterality Date   COLONOSCOPY WITH PROPOFOL  N/A 01/04/2021   Procedure: COLONOSCOPY WITH PROPOFOL ;  Surgeon: Jinny Carmine, MD;  Location: [facility];  Service: Endoscopy;  Laterality: N/A;   DILATATION & CURETTAGE/HYSTEROSCOPY WITH MYOSURE N/A 03/23/2021   Procedure: DILATATION & CURETTAGE/HYSTEROSCOPY WITH MYOSURE;  Surgeon: Gorge Ade, MD;  Location: [facility] [place];  Service: Gynecology;  Laterality: N/A;   ESOPHAGOGASTRODUODENOSCOPY (EGD) WITH PROPOFOL  N/A 01/04/2021    Procedure: ESOPHAGOGASTRODUODENOSCOPY (EGD) WITH PROPOFOL ;  Surgeon: Jinny Carmine, MD;  Location: [facility];  Service: Endoscopy;  Laterality: N/A;   HYSTEROSCOPY WITH NOVASURE N/A 03/23/2021   Procedure: HYSTEROSCOPY WITH NOVASURE;  Surgeon: Gorge Ade, MD;  Location: [facility] [place];  Service: Gynecology;  Laterality: N/A;    Medications:  Current Outpatient Medications on File Prior to Visit  Medication Sig   atorvastatin  (LIPITOR) 20 MG tablet Take 1 tablet (20 mg total) by mouth daily.   insulin degludec (TRESIBA FLEXTOUCH) 200 UNIT/ML FlexTouch Pen Inject 34 Units into the skin daily.   Lancets (ONETOUCH DELICA PLUS LANCET33G) MISC    lisinopril  (ZESTRIL ) 20 MG tablet Take 20 mg by mouth daily.   mupirocin  ointment (BACTROBAN ) 2 % Apply to healing wound QD   ONE TOUCH ULTRA TEST test strip    Semaglutide, 2 MG/DOSE, 8 MG/3ML SOPN Inject 2 mg into the skin.   No current facility-administered medications on file prior to visit.    Allergies:  Allergies  Allergen Reactions   Actos [Pioglitazone] Other (See Comments)    Weight gain    Social History:  Social History   Socioeconomic History   Marital status: Married    Spouse name: Not on file   Number of children: Not on file   Years of education: Not on file   Highest education level: Some college, no degree  Occupational History   Not on file  Tobacco Use   Smoking status: Never   Smokeless tobacco: Never  Vaping Use   Vaping status: Never Used  Substance and Sexual Activity   Alcohol use: No   Drug use: Never   Sexual activity: Yes  Other Topics Concern   Not on file  Social History Narrative   Not on file   Social Drivers of Health   Financial Resource Strain: Low Risk  (01/24/2024)   Overall Financial Resource Strain (CARDIA)    Difficulty of Paying Living Expenses: Not hard at all  Food Insecurity: No Food Insecurity (01/24/2024)   Hunger Vital Sign    Worried About Running Out of  Food in the Last Year: Never true    Ran Out of Food in the Last Year: Never true  Transportation Needs: No Transportation Needs (01/24/2024)   PRAPARE - Administrator, Civil Service (Medical): No    Lack of Transportation (Non-Medical): No  Physical Activity: Sufficiently Active (01/24/2024)   Exercise Vital Sign    Days of Exercise per Week: 4 days    Minutes of Exercise per Session: 60 min  Stress: No Stress Concern Present (01/24/2024)   [employer] of Occupational Health - Occupational Stress Questionnaire    Feeling of Stress: Not at all  Social Connections: Socially Integrated (01/24/2024)   Social Connection and Isolation Panel    Frequency of Communication with Friends and Family: More than three times a week    Frequency of  Social Gatherings with Friends and Family: More than three times a week    Attends Religious Services: More than 4 times per year    Active Member of [employer] or Organizations: Yes    Attends Engineer, structural: More than 4 times per year    Marital Status: Married  Catering manager Violence: Not At Risk (01/31/2024)   Humiliation, Afraid, Rape, and Kick questionnaire    Fear of Current or Ex-Partner: No    Emotionally Abused: No    Physically Abused: No    Sexually Abused: No   Social History   Tobacco Use  Smoking Status Never  Smokeless Tobacco Never   Social History   Substance and Sexual Activity  Alcohol Use No    Family History:  Family History  Problem Relation Age of Onset   Diabetes Mother    Hypertension Mother    Skin cancer Mother    Heart disease Father    Alcohol abuse Father    Hypertension Father    Diabetes Sister    Arthritis Sister        RA    Past medical history, surgical history, medications, allergies, family history and social history reviewed with patient today and changes made to appropriate areas of the chart.   Review of Systems  HENT:         Denies vision changes.  Eyes:  Negative  for blurred vision and double vision.  Respiratory:  Negative for shortness of breath.   Cardiovascular:  Negative for chest pain, palpitations and leg swelling.  Neurological:  Negative for dizziness, tingling and headaches.  Endo/Heme/Allergies:  Negative for polydipsia.       Denies Polyuria   All other ROS negative except what is listed above and in the HPI.      Objective:    BP 104/73   Pulse (!) 105   Temp 98.5 F (36.9 C) (Oral)   Ht 5' 5 (1.651 m)   Wt 143 lb 3.2 oz (65 kg)   SpO2 98%   BMI 23.83 kg/m   Wt Readings from Last 3 Encounters:  01/31/24 143 lb 3.2 oz (65 kg)  03/20/23 146 lb 1.6 oz (66.3 kg)  12/26/22 146 lb 9.6 oz (66.5 kg)    Physical Exam Vitals and nursing note reviewed.  Constitutional:      General: She is awake. She is not in acute distress.    Appearance: Normal appearance. She is well-developed. She is not ill-appearing.  HENT:     Head: Normocephalic and atraumatic.     Right Ear: Hearing, tympanic membrane, ear canal and external ear normal. No drainage.     Left Ear: Hearing, tympanic membrane, ear canal and external ear normal. No drainage.     Nose: Nose normal.     Right Sinus: No maxillary sinus tenderness or frontal sinus tenderness.     Left Sinus: No maxillary sinus tenderness or frontal sinus tenderness.     Mouth/Throat:     Mouth: Mucous membranes are moist.     Pharynx: Oropharynx is clear. Uvula midline. No pharyngeal swelling, oropharyngeal exudate or posterior oropharyngeal erythema.  Eyes:     General: Lids are normal.        Right eye: No discharge.        Left eye: No discharge.     Extraocular Movements: Extraocular movements intact.     Conjunctiva/sclera: Conjunctivae normal.     Pupils: Pupils are equal, round, and reactive to light.  Visual Fields: Right eye visual fields normal and left eye visual fields normal.  Neck:     Thyroid : No thyromegaly.     Vascular: No carotid bruit.     Trachea: Trachea  normal.  Cardiovascular:     Rate and Rhythm: Normal rate and regular rhythm.     Heart sounds: Normal heart sounds. No murmur heard.    No gallop.  Pulmonary:     Effort: Pulmonary effort is normal. No accessory muscle usage or respiratory distress.     Breath sounds: Normal breath sounds.  Chest:  Breasts:    Right: Normal.     Left: Normal.  Abdominal:     General: Bowel sounds are normal.     Palpations: Abdomen is soft. There is no hepatomegaly or splenomegaly.     Tenderness: There is no abdominal tenderness.  Musculoskeletal:        General: Normal range of motion.     Cervical back: Normal range of motion and neck supple.     Right lower leg: No edema.     Left lower leg: No edema.  Lymphadenopathy:     Head:     Right side of head: No submental, submandibular, tonsillar, preauricular or posterior auricular adenopathy.     Left side of head: No submental, submandibular, tonsillar, preauricular or posterior auricular adenopathy.     Cervical: No cervical adenopathy.     Upper Body:     Right upper body: No supraclavicular, axillary or pectoral adenopathy.     Left upper body: No supraclavicular, axillary or pectoral adenopathy.  Skin:    General: Skin is warm and dry.     Capillary Refill: Capillary refill takes less than 2 seconds.     Findings: No rash.  Neurological:     Mental Status: She is alert and oriented to person, place, and time.     Gait: Gait is intact.  Psychiatric:        Attention and Perception: Attention normal.        Mood and Affect: Mood normal.        Speech: Speech normal.        Behavior: Behavior normal. Behavior is cooperative.        Thought Content: Thought content normal.        Judgment: Judgment normal.     Results for orders placed or performed in visit on 12/10/23  Rapid Strep screen([facility])   Collection Time: 12/10/23  9:36 AM   Specimen: Other   Other  Result Value Ref Range   Strep Gp A Ag, IA W/Reflex Negative  Negative  Culture, Group A Strep   Collection Time: 12/10/23  9:36 AM   Other  Result Value Ref Range   Strep A Culture Negative   Influenza A & B (STAT)   Collection Time: 12/10/23  9:36 AM  Result Value Ref Range   Influenza A Negative Negative   Influenza B Negative Negative  Novel Coronavirus, NAA ([facility])   Collection Time: 12/10/23 10:03 AM   Specimen: Nasopharyngeal(NP) swabs in vial transport medium  Result Value Ref Range   SARS-CoV-2, NAA Not Detected Not Detected      Assessment & Plan:   Problem List Items Addressed This Visit       Cardiovascular and Mediastinum   Hypertension   Chronic.  Controlled.  Continue with current medication regimen of Lisinopril .  Labs ordered today.  Return to clinic in 6 months for reevaluation.  Call sooner if  concerns arise.         Endocrine   Poorly controlled type 2 diabetes mellitus (HCC)   Chronic. Not under good control with A1c of 7.7% - following with endocrinology. Continue to follow with them. Call with any concerns. Now on Tresiba, Metformin , Jardiance  and Ozempic.  Doing well with medications.  Follow up in 6 months.  Call sooner if concerns arise. - Recent eye exam was done at Dr. Brigid office.      Relevant Medications   Semaglutide, 2 MG/DOSE, 8 MG/3ML SOPN   metFORMIN  (GLUCOPHAGE -XR) 500 MG 24 hr tablet   empagliflozin  (JARDIANCE ) 25 MG TABS tablet   Other Relevant Orders   Hemoglobin A1c     Other   Iron  deficiency anemia (Chronic)   Chronic.  Controlled.  Continue with current medication regimen.  Labs ordered today.  Return to clinic in 6 months for reevaluation.  Call sooner if concerns arise.        Relevant Orders   Comprehensive metabolic panel with GFR   Iron , TIBC and Ferritin Panel   Hyperlipidemia   Chronic.  Controlled.  Continue with current medication regimen of Atorvastatin .  Labs ordered today.  Return to clinic in 6 months for reevaluation.  Call sooner if concerns arise.        Relevant Orders   Lipid panel   Other Visit Diagnoses       Annual physical exam    -  Primary   Health maintenance reviewed during visit today.  Labs ordered.  Vaccines reviewed. Colonoscopy, Mammogram, and PAP are up date.   Relevant Orders   CBC with Differential/Platelet   Comprehensive metabolic panel with GFR   Lipid panel   TSH   Hemoglobin A1c   Microalbumin, Urine Waived        Follow up plan: Return in about 6 months (around 08/02/2024) for HTN, HLD, DM2 FU.   LABORATORY TESTING:  - Pap smear: not applicable  IMMUNIZATIONS:   - Tdap: Tetanus vaccination status reviewed: last tetanus booster within 10 years. - Influenza: Postponed to flu season - Pneumovax: Refused - Prevnar: Not applicable - COVID: Not applicable - HPV: Not applicable - Shingrix vaccine: Refused  SCREENING: -Mammogram: Up to date  - Colonoscopy: Up to date  - Bone Density: Not applicable  -Hearing Test: Not applicable  -Spirometry: Not applicable   PATIENT COUNSELING:   Advised to take 1 mg of folate supplement per day if capable of pregnancy.   Sexuality: Discussed sexually transmitted diseases, partner selection, use of condoms, avoidance of unintended pregnancy  and contraceptive alternatives.   Advised to avoid cigarette smoking.  I discussed with the patient that most people either abstain from alcohol or drink within safe limits (<=14/week and <=4 drinks/occasion for males, <=7/weeks and <= 3 drinks/occasion for females) and that the risk for alcohol disorders and other health effects rises proportionally with the number of drinks per week and how often a drinker exceeds daily limits.  Discussed cessation/primary prevention of drug use and availability of treatment for abuse.   Diet: Encouraged to adjust caloric intake to maintain  or achieve ideal body weight, to reduce intake of dietary saturated fat and total fat, to limit sodium intake by avoiding high sodium foods and not adding  table salt, and to maintain adequate dietary potassium and calcium  preferably from fresh fruits, vegetables, and low-fat dairy products.    stressed the importance of regular exercise  Injury prevention: Discussed safety belts, safety helmets, smoke detector,  smoking near bedding or upholstery.   Dental health: Discussed importance of regular tooth brushing, flossing, and dental visits.    NEXT PREVENTATIVE PHYSICAL DUE IN 1 YEAR. Return in about 6 months (around 08/02/2024) for HTN, HLD, DM2 FU.

## 2024-02-01 ENCOUNTER — Ambulatory Visit: Payer: Self-pay | Admitting: Nurse Practitioner

## 2024-02-01 LAB — CBC WITH DIFFERENTIAL/PLATELET
Basophils Absolute: 0.1 x10E3/uL (ref 0.0–0.2)
Basos: 1 %
EOS (ABSOLUTE): 0.4 x10E3/uL (ref 0.0–0.4)
Eos: 5 %
Hematocrit: 46.6 % (ref 34.0–46.6)
Hemoglobin: 15 g/dL (ref 11.1–15.9)
Immature Grans (Abs): 0 x10E3/uL (ref 0.0–0.1)
Immature Granulocytes: 0 %
Lymphocytes Absolute: 2.4 x10E3/uL (ref 0.7–3.1)
Lymphs: 34 %
MCH: 27.1 pg (ref 26.6–33.0)
MCHC: 32.2 g/dL (ref 31.5–35.7)
MCV: 84 fL (ref 79–97)
Monocytes Absolute: 0.4 x10E3/uL (ref 0.1–0.9)
Monocytes: 6 %
Neutrophils Absolute: 3.9 x10E3/uL (ref 1.4–7.0)
Neutrophils: 53 %
Platelets: 305 x10E3/uL (ref 150–450)
RBC: 5.54 x10E6/uL — ABNORMAL HIGH (ref 3.77–5.28)
RDW: 13.1 % (ref 11.7–15.4)
WBC: 7.3 x10E3/uL (ref 3.4–10.8)

## 2024-02-01 LAB — HEMOGLOBIN A1C
Est. average glucose Bld gHb Est-mCnc: 160 mg/dL
Hgb A1c MFr Bld: 7.2 % — ABNORMAL HIGH (ref 4.8–5.6)

## 2024-02-01 LAB — COMPREHENSIVE METABOLIC PANEL WITH GFR
ALT: 17 IU/L (ref 0–32)
AST: 16 IU/L (ref 0–40)
Albumin: 4.4 g/dL (ref 3.8–4.9)
Alkaline Phosphatase: 136 IU/L — ABNORMAL HIGH (ref 44–121)
BUN/Creatinine Ratio: 25 — ABNORMAL HIGH (ref 9–23)
BUN: 16 mg/dL (ref 6–24)
Bilirubin Total: 0.4 mg/dL (ref 0.0–1.2)
CO2: 21 mmol/L (ref 20–29)
Calcium: 9.9 mg/dL (ref 8.7–10.2)
Chloride: 99 mmol/L (ref 96–106)
Creatinine, Ser: 0.65 mg/dL (ref 0.57–1.00)
Globulin, Total: 2.4 g/dL (ref 1.5–4.5)
Glucose: 159 mg/dL — ABNORMAL HIGH (ref 70–99)
Potassium: 4.7 mmol/L (ref 3.5–5.2)
Sodium: 139 mmol/L (ref 134–144)
Total Protein: 6.8 g/dL (ref 6.0–8.5)
eGFR: 106 mL/min/1.73 (ref >59)

## 2024-02-01 LAB — LIPID PANEL
Chol/HDL Ratio: 3.6 ratio (ref 0.0–4.4)
Cholesterol, Total: 155 mg/dL (ref 100–199)
HDL: 43 mg/dL (ref >39)
LDL Chol Calc (NIH): 86 mg/dL (ref 0–99)
Triglycerides: 147 mg/dL (ref 0–149)
VLDL Cholesterol Cal: 26 mg/dL (ref 5–40)

## 2024-02-01 LAB — IRON,TIBC AND FERRITIN PANEL
Ferritin: 68 ng/mL (ref 15–150)
Iron Saturation: 24 % (ref 15–55)
Iron: 85 ug/dL (ref 27–159)
Total Iron Binding Capacity: 356 ug/dL (ref 250–450)
UIBC: 271 ug/dL (ref 131–425)

## 2024-02-01 LAB — TSH: TSH: 1.94 u[IU]/mL (ref 0.450–4.500)

## 2024-05-22 ENCOUNTER — Encounter: Payer: Self-pay | Admitting: Dermatology

## 2024-05-22 ENCOUNTER — Ambulatory Visit: Payer: BC Managed Care – PPO | Admitting: Dermatology

## 2024-05-22 DIAGNOSIS — Z86018 Personal history of other benign neoplasm: Secondary | ICD-10-CM

## 2024-05-22 DIAGNOSIS — L814 Other melanin hyperpigmentation: Secondary | ICD-10-CM

## 2024-05-22 DIAGNOSIS — D492 Neoplasm of unspecified behavior of bone, soft tissue, and skin: Secondary | ICD-10-CM | POA: Diagnosis not present

## 2024-05-22 DIAGNOSIS — L578 Other skin changes due to chronic exposure to nonionizing radiation: Secondary | ICD-10-CM | POA: Diagnosis not present

## 2024-05-22 DIAGNOSIS — D1801 Hemangioma of skin and subcutaneous tissue: Secondary | ICD-10-CM

## 2024-05-22 DIAGNOSIS — L820 Inflamed seborrheic keratosis: Secondary | ICD-10-CM | POA: Diagnosis not present

## 2024-05-22 DIAGNOSIS — Z1283 Encounter for screening for malignant neoplasm of skin: Secondary | ICD-10-CM

## 2024-05-22 DIAGNOSIS — D229 Melanocytic nevi, unspecified: Secondary | ICD-10-CM

## 2024-05-22 DIAGNOSIS — W908XXA Exposure to other nonionizing radiation, initial encounter: Secondary | ICD-10-CM | POA: Diagnosis not present

## 2024-05-22 DIAGNOSIS — D225 Melanocytic nevi of trunk: Secondary | ICD-10-CM

## 2024-05-22 DIAGNOSIS — L821 Other seborrheic keratosis: Secondary | ICD-10-CM

## 2024-05-22 NOTE — Patient Instructions (Addendum)
 Cryotherapy Aftercare  Wash gently with soap and water everyday.   Apply Vaseline Jelly daily until healed.    Wound Care Instructions  Cleanse wound gently with soap and water once a day then pat dry with clean gauze. Apply a thin coat of Petrolatum (petroleum jelly, Vaseline) over the wound (unless you have an allergy to this). We recommend that you use a new, sterile tube of Vaseline. Do not pick or remove scabs. Do not remove the yellow or white healing tissue from the base of the wound.  Cover the wound with fresh, clean, nonstick gauze and secure with paper tape. You may use Band-Aids in place of gauze and tape if the wound is small enough, but would recommend trimming much of the tape off as there is often too much. Sometimes Band-Aids can irritate the skin.  You should call the office for your biopsy report after 1 week if you have not already been contacted.  If you experience any problems, such as abnormal amounts of bleeding, swelling, significant bruising, significant pain, or evidence of infection, please call the office immediately.  FOR ADULT SURGERY PATIENTS: If you need something for pain relief you may take 1 extra strength Tylenol  (acetaminophen ) AND 2 Ibuprofen (200mg  each) together every 4 hours as needed for pain. (do not take these if you are allergic to them or if you have a reason you should not take them.) Typically, you may only need pain medication for 1 to 3 days.      Recommend daily broad spectrum sunscreen SPF 30+ to sun-exposed areas, reapply every 2 hours as needed. Call for new or changing lesions.  Staying in the shade or wearing long sleeves, sun glasses (UVA+UVB protection) and wide brim hats (4-inch brim around the entire circumference of the hat) are also recommended for sun protection.      Melanoma ABCDEs  Melanoma is the most dangerous type of skin cancer, and is the leading cause of death from skin disease.  You are more likely to develop  melanoma if you: Have light-colored skin, light-colored eyes, or red or blond hair Spend a lot of time in the sun Tan regularly, either outdoors or in a tanning bed Have had blistering sunburns, especially during childhood Have a close family member who has had a melanoma Have atypical moles or large birthmarks  Early detection of melanoma is key since treatment is typically straightforward and cure rates are extremely high if we catch it early.   The first sign of melanoma is often a change in a mole or a new dark spot.  The ABCDE system is a way of remembering the signs of melanoma.  A for asymmetry:  The two halves do not match. B for border:  The edges of the growth are irregular. C for color:  A mixture of colors are present instead of an even brown color. D for diameter:  Melanomas are usually (but not always) greater than 6mm - the size of a pencil eraser. E for evolution:  The spot keeps changing in size, shape, and color.  Please check your skin once per month between visits. You can use a small mirror in front and a large mirror behind you to keep an eye on the back side or your body.   If you see any new or changing lesions before your next follow-up, please call to schedule a visit.  Please continue daily skin protection including broad spectrum sunscreen SPF 30+ to sun-exposed areas, reapplying every  2 hours as needed when you're outdoors.   Staying in the shade or wearing long sleeves, sun glasses (UVA+UVB protection) and wide brim hats (4-inch brim around the entire circumference of the hat) are also recommended for sun protection.      Due to recent changes in healthcare laws, you may see results of your pathology and/or laboratory studies on MyChart before the doctors have had a chance to review them. We understand that in some cases there may be results that are confusing or concerning to you. Please understand that not all results are received at the same time and often  the doctors may need to interpret multiple results in order to provide you with the best plan of care or course of treatment. Therefore, we ask that you please give us  2 business days to thoroughly review all your results before contacting the office for clarification. Should we see a critical lab result, you will be contacted sooner.   If You Need Anything After Your Visit  If you have any questions or concerns for your doctor, please call our main line at [phone] and press option 4 to reach your doctor's medical assistant. If no one answers, please leave a voicemail as directed and we will return your call as soon as possible. Messages left after 4 pm will be answered the following business day.   You may also send us  a message via MyChart. We typically respond to MyChart messages within 1-2 business days.  For prescription refills, please ask your pharmacy to contact our office. Our fax number is [phone].  If you have an urgent issue when the clinic is closed that cannot wait until the next business day, you can page your doctor at the number below.    Please note that while we do our best to be available for urgent issues outside of office hours, we are not available 24/7.   If you have an urgent issue and are unable to reach us , you may choose to seek medical care at your doctor's office, retail clinic, urgent care center, or emergency room.  If you have a medical emergency, please immediately call 911 or go to the emergency department.  Pager Numbers  - Dr. Hester: [phone]  - Dr. Jackquline: [phone]  - Dr. Claudene: [phone]   - Dr. Raymund: [phone]  In the event of inclement weather, please call our main line at [phone] for an update on the status of any delays or closures.  Dermatology Medication Tips: Please keep the boxes that topical medications come in in order to help keep track of the instructions about where and how to use these. Pharmacies  typically print the medication instructions only on the boxes and not directly on the medication tubes.   If your medication is too expensive, please contact our office at [phone] option 4 or send us  a message through MyChart.   We are unable to tell what your co-pay for medications will be in advance as this is different depending on your insurance coverage. However, we may be able to find a substitute medication at lower cost or fill out paperwork to get insurance to cover a needed medication.   If a prior authorization is required to get your medication covered by your insurance company, please allow us  1-2 business days to complete this process.  Drug prices often vary depending on where the prescription is filled and some pharmacies may offer cheaper prices.  The website www.goodrx.com contains coupons for medications through  different pharmacies. The prices here do not account for what the cost may be with help from insurance (it may be cheaper with your insurance), but the website can give you the price if you did not use any insurance.  - You can print the associated coupon and take it with your prescription to the pharmacy.  - You may also stop by our office during regular business hours and pick up a GoodRx coupon card.  - If you need your prescription sent electronically to a different pharmacy, notify our office through [facility] or by phone at [phone] option 4.     Si Usted Necesita Algo Despus de Su Visita  Tambin puede enviarnos un mensaje a travs de Clinical cytogeneticist. Por lo general respondemos a los mensajes de MyChart en el transcurso de 1 a 2 das hbiles.  Para renovar recetas, por favor pida a su farmacia que se ponga en contacto con nuestra oficina. Randi lakes de fax es [place] [phone].  Si tiene un asunto urgente cuando la clnica est cerrada y que no puede esperar hasta el siguiente da hbil, puede llamar/localizar a su doctor(a) al nmero que  aparece a continuacin.   Por favor, tenga en cuenta que aunque hacemos todo lo posible para estar disponibles para asuntos urgentes fuera del horario de [place], no estamos disponibles las 24 horas del da, los 7 [address]  [address] de la [place].   Si tiene un problema urgente y no puede comunicarse con nosotros, puede optar por buscar atencin mdica  en el consultorio de su doctor(a), en una clnica privada, en un centro de atencin urgente o en una sala de emergencias.  Si tiene Engineer, drilling, por favor llame inmediatamente al 911 o vaya a la sala de emergencias.  Nmeros de bper  - Dr. Hester: [phone]  - Dra. Jackquline: [phone]  - Dr. Claudene: [phone]  - Dra. Kitts: [phone]  En caso de inclemencias del [place], por favor llame a nuestra lnea principal al [phone] para una actualizacin sobre el estado de cualquier retraso o cierre.  Consejos para la medicacin en dermatologa: Por favor, guarde las cajas en las que vienen los medicamentos de uso tpico para ayudarle a seguir las instrucciones sobre dnde y cmo usarlos. Las farmacias generalmente imprimen las instrucciones del medicamento slo en las cajas y no directamente en los tubos del [place].   Si su medicamento es muy caro, por favor, pngase en contacto con landry rieger llamando al [phone] y presione la opcin 4 o envenos un mensaje a travs de Clinical cytogeneticist.   No podemos decirle cul ser su copago por los medicamentos por adelantado ya que esto es diferente dependiendo de la cobertura de su seguro. Sin embargo, es posible que podamos encontrar un medicamento sustituto a Audiological scientist un formulario para que el seguro cubra el medicamento que se considera necesario.   Si se requiere una autorizacin previa para que su compaa de seguros malta su medicamento, por favor permtanos de 1 a 2 das hbiles para completar este proceso.  Los precios de los medicamentos varan con frecuencia  dependiendo del Environmental consultant de dnde se surte la receta y alguna farmacias pueden ofrecer precios ms baratos.  El sitio web www.goodrx.com tiene cupones para medicamentos de Health and safety inspector. Los precios aqu no tienen en cuenta lo que podra costar con la ayuda del seguro (puede ser ms barato con su seguro), pero el sitio web puede darle el precio si no utiliz Tourist information centre manager.  - Puede imprimir el  cupn correspondiente y llevarlo con su receta a la farmacia.  - Tambin puede pasar por nuestra oficina durante el horario de atencin regular y Education officer, museum una tarjeta de cupones de GoodRx.  - Si necesita que su receta se enve electrnicamente a una farmacia diferente, informe a nuestra oficina a travs de MyChart de [place] o por telfono llamando al [phone] y presione la opcin 4.

## 2024-05-22 NOTE — Progress Notes (Signed)
 Follow-Up Visit   Subjective  Ellen May is a 53 y.o. female who presents for the following: Skin Cancer Screening and Full Body Skin Exam. Hx of DN.   Brown spot on right cheek and left thigh. Lesions have texture to them now.   The patient presents for Total-Body Skin Exam (TBSE) for skin cancer screening and mole check. The patient has spots, moles and lesions to be evaluated, some may be new or changing and the patient may have concern these could be cancer.    The following portions of the chart were reviewed this encounter and updated as appropriate: medications, allergies, medical history  Review of Systems:  No other skin or systemic complaints except as noted in HPI or Assessment and Plan.  Objective  Well appearing patient in no apparent distress; mood and affect are within normal limits.  A full examination was performed including scalp, head, eyes, ears, nose, lips, neck, chest, axillae, abdomen, back, buttocks, bilateral upper extremities, bilateral lower extremities, hands, feet, fingers, toes, fingernails, and toenails. All findings within normal limits unless otherwise noted below.   Relevant physical exam findings are noted in the Assessment and Plan.  Right Infra-orbital x1 Erythematous keratotic or waxy stuck-on papule or plaque.  Right Axilla 0.7 cm brown papule   Assessment & Plan   SKIN CANCER SCREENING PERFORMED TODAY.  HISTORY OF DYSPLASTIC NEVI No evidence of recurrence today Recommend regular full body skin exams Recommend daily broad spectrum sunscreen SPF 30+ to sun-exposed areas, reapply every 2 hours as needed.  Call if any new or changing lesions are noted between office visits   ACTINIC DAMAGE - Chronic condition, secondary to cumulative UV/sun exposure - diffuse scaly erythematous macules with underlying dyspigmentation - Recommend daily broad spectrum sunscreen SPF 30+ to sun-exposed areas, reapply every 2 hours as needed.  -  Staying in the shade or wearing long sleeves, sun glasses (UVA+UVB protection) and wide brim hats (4-inch brim around the entire circumference of the hat) are also recommended for sun protection.  - Call for new or changing lesions.  LENTIGINES, SEBORRHEIC KERATOSES, HEMANGIOMAS - Benign normal skin lesions - Benign-appearing - Call for any changes  MELANOCYTIC NEVI - Tan-brown and/or pink-flesh-colored symmetric macules and papules - Benign appearing on exam today - Observation - Call clinic for new or changing moles - Recommend daily use of broad spectrum spf 30+ sunscreen to sun-exposed areas.    HEMANGIOMA Exam: violaceous papule at right chest without features suspicious for malignancy on dermoscopy. Features consistent with hemangioma.   Discussed benign nature. Recommend observation. Call for changes.    INFLAMED SEBORRHEIC KERATOSIS Right Infra-orbital x1 Symptomatic, irritating, patient would like treated. Destruction of lesion - Right Infra-orbital x1 Complexity: simple   Destruction method: cryotherapy   Informed consent: discussed and consent obtained   Timeout:  patient name, date of birth, surgical site, and procedure verified Lesion destroyed using liquid nitrogen: Yes   Region frozen until ice ball extended beyond lesion: Yes   Outcome: patient tolerated procedure well with no complications   Post-procedure details: wound care instructions given   Additional details:  Prior to procedure, discussed risks of blister formation, small wound, skin dyspigmentation, or rare scar following cryotherapy. Recommend Vaseline ointment to treated areas while healing.   NEOPLASM OF SKIN Right Axilla Epidermal / dermal shaving  Lesion diameter (cm):  0.7 Informed consent: discussed and consent obtained   Timeout: patient name, date of birth, surgical site, and procedure verified   Procedure  prep:  Patient was prepped and draped in usual sterile fashion Prep type:   Isopropyl alcohol Anesthesia: the lesion was anesthetized in a standard fashion   Anesthetic:  1% lidocaine  w/ epinephrine 1-100,000 buffered w/ 8.4% NaHCO3 Instrument used: flexible razor blade   Hemostasis achieved with: pressure, aluminum chloride and electrodesiccation   Outcome: patient tolerated procedure well   Post-procedure details: sterile dressing applied and wound care instructions given   Dressing type: bandage and petrolatum    Specimen 1 - Surgical pathology Differential Diagnosis: SK vs Nevus R/O dysplasia  Check Margins: Yes Return in about 1 year (around 05/22/2025) for TBSE, HxDN.  I, Floraine Buechler, CMA, am acting as scribe for Alm Rhyme, MD.   Documentation: I have reviewed the above documentation for accuracy and completeness, and I agree with the above.  Alm Rhyme, MD

## 2024-05-27 ENCOUNTER — Ambulatory Visit: Payer: Self-pay | Admitting: Dermatology

## 2024-05-27 LAB — SURGICAL PATHOLOGY

## 2024-05-28 ENCOUNTER — Encounter: Payer: Self-pay | Admitting: Dermatology

## 2024-05-28 NOTE — Telephone Encounter (Addendum)
 Tried calling patient regarding results. No answer. Lm for patient to return call. ----- Message from Alm Rhyme sent at 05/27/2024  6:46 PM EST ----- FINAL DIAGNOSIS        1. Skin, right axilla :       DYSPLASTIC COMPOUND NEVUS WITH MODERATE ATYPIA, DEEP MARGIN INVOLVED   Moderate dysplastic Recheck next visit ----- Message ----- From: Interface, Lab In Three [facility] Seven Sent: 05/27/2024   6:14 PM EST To: Alm JAYSON Rhyme, MD

## 2024-05-29 NOTE — Telephone Encounter (Addendum)
 Tried calling patient about bx results. No answer. LM for patient return call.   ----- Message from Ellen May sent at 05/27/2024  6:46 PM EST ----- FINAL DIAGNOSIS        1. Skin, right axilla :       DYSPLASTIC COMPOUND NEVUS WITH MODERATE ATYPIA, DEEP MARGIN INVOLVED   Moderate dysplastic Recheck next visit ----- Message ----- From: Interface, Lab In Three [facility] Seven Sent: 05/27/2024   6:14 PM EST To: Ellen JAYSON Rhyme, MD

## 2024-06-05 NOTE — Telephone Encounter (Addendum)
 Tried calling patient regarding results. Unable to leave message due to voice mail being full.  Will send letter regarding results.  Have tried calling patient more than 3 times.  ----- Message from Alm Rhyme sent at 05/27/2024  6:46 PM EST ----- FINAL DIAGNOSIS        1. Skin, right axilla :       DYSPLASTIC COMPOUND NEVUS WITH MODERATE ATYPIA, DEEP MARGIN INVOLVED   Moderate dysplastic Recheck next visit ----- Message ----- From: Interface, Lab In Three Zero Seven Sent: 05/27/2024   6:14 PM EST To: Alm JAYSON Rhyme, MD

## 2024-06-06 ENCOUNTER — Ambulatory Visit: Admitting: Nurse Practitioner

## 2024-06-06 ENCOUNTER — Encounter: Payer: Self-pay | Admitting: Nurse Practitioner

## 2024-06-06 VITALS — BP 118/75 | HR 94 | Temp 98.1°F | Resp 15 | Ht 65.0 in | Wt 142.0 lb

## 2024-06-06 DIAGNOSIS — G8929 Other chronic pain: Secondary | ICD-10-CM | POA: Diagnosis not present

## 2024-06-06 DIAGNOSIS — G5602 Carpal tunnel syndrome, left upper limb: Secondary | ICD-10-CM

## 2024-06-06 DIAGNOSIS — M25511 Pain in right shoulder: Secondary | ICD-10-CM

## 2024-06-06 NOTE — Progress Notes (Signed)
 BP 118/75 (BP Location: Left Arm, Patient Position: Sitting, Cuff Size: Normal)   Pulse 94   Temp 98.1 F (36.7 C) (Oral)   Resp 15   Ht 5' 5 (1.651 m)   Wt 142 lb (64.4 kg)   SpO2 98%   BMI 23.63 kg/m    Subjective:    Patient ID: Ellen May, female    DOB: [birth year], 53 y.o.   MRN: [id number]  HPI: Ellen May is a 53 y.o. female  Chief Complaint  Patient presents with   Neck Injury    2 years ago but started the flare up last few months.     Patient states a couple of years ago she was playing dodge ball with kids are her church and her feet got tangled up and she fell.  On the right side she isn't able to raise her right shoulder up.  She has numbness and tingling of the first 3 fingers on the left hand.  She has been doing physical therapy on her shoulder which has helped but she states she would have to do the exercises everyday to help with the symptoms.  Shoulder does her hurt when moves it above her head and behind her back.  This has been going on for over a year. Not able to undo her bra reaching back behind her.  States her fingers have been numb for about 6 months.       Relevant past medical, surgical, family and social history reviewed and updated as indicated. Interim medical history since our last visit reviewed. Allergies and medications reviewed and updated.  Review of Systems  Musculoskeletal:        Right shoulder pain and decreased ROM  Neurological:  Positive for numbness.    Per HPI unless specifically indicated above     Objective:    BP 118/75 (BP Location: Left Arm, Patient Position: Sitting, Cuff Size: Normal)   Pulse 94   Temp 98.1 F (36.7 C) (Oral)   Resp 15   Ht 5' 5 (1.651 m)   Wt 142 lb (64.4 kg)   SpO2 98%   BMI 23.63 kg/m   Wt Readings from Last 3 Encounters:  06/06/24 142 lb (64.4 kg)  01/31/24 143 lb 3.2 oz (65 kg)  03/20/23 146 lb 1.6 oz (66.3 kg)    Physical Exam Vitals and nursing note reviewed.   Constitutional:      General: She is not in acute distress.    Appearance: Normal appearance. She is normal weight. She is not ill-appearing, toxic-appearing or diaphoretic.  HENT:     Head: Normocephalic.     Right Ear: External ear normal.     Left Ear: External ear normal.     Nose: Nose normal.     Mouth/Throat:     Mouth: Mucous membranes are moist.     Pharynx: Oropharynx is clear.  Eyes:     General:        Right eye: No discharge.        Left eye: No discharge.     Extraocular Movements: Extraocular movements intact.     Conjunctiva/sclera: Conjunctivae normal.     Pupils: Pupils are equal, round, and reactive to light.  Cardiovascular:     Rate and Rhythm: Normal rate and regular rhythm.     Heart sounds: No murmur heard. Pulmonary:     Effort: Pulmonary effort is normal. No respiratory distress.     Breath sounds: Normal breath  sounds. No wheezing or rales.  Musculoskeletal:     Right shoulder: No swelling, deformity, effusion, laceration, tenderness, bony tenderness or crepitus. Decreased range of motion. Decreased strength. Normal pulse.     Cervical back: Normal range of motion and neck supple.  Skin:    General: Skin is warm and dry.     Capillary Refill: Capillary refill takes less than 2 seconds.  Neurological:     General: No focal deficit present.     Mental Status: She is alert and oriented to person, place, and time. Mental status is at baseline.  Psychiatric:        Mood and Affect: Mood normal.        Behavior: Behavior normal.        Thought Content: Thought content normal.        Judgment: Judgment normal.     Results for orders placed or performed in visit on 05/22/24  Surgical pathology   Collection Time: 05/22/24 12:00 AM  Result Value Ref Range   SURGICAL PATHOLOGY      SURGICAL PATHOLOGY [facility] [address], [address] [place], [place] [address] Telephone [phone] or [phone] Fax [phone]  REPORT  OF DERMATOPATHOLOGY   Accession #: [id number] Patient Name: Ellen, May Visit # : [id number]  MRN: [id number] Cytotechnologist: Munoz-Bishop Md, Eleanor, Dermatopathologist, Electronic Signature DOB/Age Jul 05, 1971 (Age: 50) Gender: F Collected Date: 05/22/2024 Received Date: 05/22/2024  FINAL DIAGNOSIS       1. Skin, right axilla :       DYSPLASTIC COMPOUND NEVUS WITH MODERATE ATYPIA, DEEP MARGIN INVOLVED       DATE SIGNED OUT: 05/27/2024 ELECTRONIC SIGNATURE : Munoz-Bishop Md, Melissa, Dermatopathologist, Electronic Signature  MICROSCOPIC DESCRIPTION 1. There is a proliferation of enlarged melanocytes, showing moderate atypia, arranged mostly as nests at the dermoepidermal junction and in the dermis.  There is bridging of rete ridges by nests and some fibroplasia in the papillary  dermis.  These are the findings of a dysplastic nevus with moderate atypia.  The lesion extends to the deep margin.  CASE COMMENTS STAINS USED IN DIAGNOSIS: H&E    CLINICAL HISTORY  SPECIMEN(S) OBTAINED 1. Skin, Right Axilla  SPECIMEN COMMENTS: 1. 0.7 cm brown papule, check margins SPECIMEN CLINICAL INFORMATION: 1. Neoplasm of skin, SK vs nevus R/O dysplasia    Gross Description 1. Formalin fixed specimen received:  7 X 5 X 1 MM, TOTO (3 P) (1 B) ( th ) margins inked.        Report signed out from the following location(s) [facility] [address] [place], [place] [address] CLIA #: [id number]  [facility] [address] [place], [place] [address] CLIA #: [id number]       Assessment & Plan:   Problem List Items Addressed This Visit   None Visit Diagnoses       Chronic right shoulder pain    -  Primary   Ongoing for over 1 year. Pain with Passive and Active ROM.  Has done phyiscal therapy without improvement in ROM or pain. MRI ordered for evaluation.   Relevant Orders   MR SHOULDER RIGHT W WO CONTRAST     Carpal tunnel  syndrome of left wrist       Recommend brace at night to help with symptoms. Patient understands and agrees to try brace.        Follow up plan: No follow-ups on file.

## 2024-06-24 ENCOUNTER — Encounter: Payer: Self-pay | Admitting: Nurse Practitioner

## 2024-07-15 ENCOUNTER — Ambulatory Visit: Payer: Self-pay

## 2024-07-15 ENCOUNTER — Ambulatory Visit: Admitting: Nurse Practitioner

## 2024-07-15 ENCOUNTER — Telehealth: Admitting: Physician Assistant

## 2024-07-15 DIAGNOSIS — Z5321 Procedure and treatment not carried out due to patient leaving prior to being seen by health care provider: Secondary | ICD-10-CM

## 2024-07-15 NOTE — Telephone Encounter (Signed)
 I can see her today at 340 and test her for flu and covid in office

## 2024-07-15 NOTE — Telephone Encounter (Signed)
 FYI Only or Action Required?: FYI only for provider: appointment scheduled on virtual, today.  Patient was last seen in primary care on 07/15/2024 by Zohra, Mobeen, PA-C.  Called Nurse Triage reporting Fever, Cough, and Generalized Body Aches.  Symptoms began yesterday.  Interventions attempted: OTC medications: coricidin cold and flu.  Symptoms are: gradually worsening.  Triage Disposition: Call PCP Within 24 Hours  Patient/caregiver understands and will follow disposition?: Yes   Copied from CRM [id number]. Topic: Clinical - Red Word Triage >> Jul 15, 2024 12:50 PM Emylou G wrote: [facility] that prompted transfer to Nurse Triage: potentially Flu, Fever, Chills ( 102 at night and 100 during the day ) coughing - dry .SABRA OTC isn't helping. Reason for Disposition  [1] Patient is NOT HIGH RISK AND [2] strongly requests antiviral medicine AND [3] flu symptoms present < 48 hours  Answer Assessment - Initial Assessment Questions 1. SYMPTOMS: What is your main symptom or concern? (e.g., cough, fever, shortness of breath, muscle aches)     Cough, body ache, chills, fever 2. ONSET: When did the symptoms start?      One day ago 3. COUGH: Do you have a cough? If Yes, ask: How bad is the cough?       Present, non productive 4. FEVER: Do you have a fever? If Yes, ask: What is your temperature, how was it measured, and when did it start?     101 tmax today 5. BREATHING DIFFICULTY: Are you having any difficulty breathing? (e.g., normal; shortness of breath, wheezing, unable to speak)      denies 6. BETTER-SAME-WORSE: Are you getting better, staying the same or getting worse compared to yesterday?  If getting worse, ask, In what way?     worse 7. OTHER SYMPTOMS: Do you have any other symptoms?  (e.g., chills, fatigue, headache, loss of smell or taste, muscle pain, sore throat)     chills 8. INFLUENZA EXPOSURE: Was there any known exposure to influenza (flu) before the symptoms  began?      Exposed to son who has flu 9. INFLUENZA SUSPECTED: Why do you think you have influenza? (e.g., positive flu self-test at home, symptoms after exposure).     Symptoms after exposure  11. HIGH RISK FOR COMPLICATIONS: Do you have any chronic medical problems? (e.g., asthma, heart or lung disease, obesity, weak immune system)       HTN, diabetes  Protocols used: Influenza (Flu) Suspected-A-AH

## 2024-07-15 NOTE — Telephone Encounter (Signed)
 Added to schedule.

## 2024-07-15 NOTE — Progress Notes (Signed)
 Pt logged off video after checking in.

## 2024-07-16 ENCOUNTER — Telehealth

## 2024-08-02 ENCOUNTER — Other Ambulatory Visit: Payer: Self-pay | Admitting: Nurse Practitioner

## 2024-08-04 NOTE — Telephone Encounter (Signed)
 Patient stated that she does not get this medication her PCP and she did not want to schedule.

## 2024-08-04 NOTE — Telephone Encounter (Signed)
 Patient is due for 6 month f/up appointment. Please call to schedule appointment and then route to provider for refill.

## 2024-08-07 LAB — HM MAMMOGRAPHY

## 2025-05-26 ENCOUNTER — Ambulatory Visit: Admitting: Dermatology
